# Patient Record
Sex: Male | Born: 1955 | Race: White | Hispanic: No | Marital: Married | Smoking: Never smoker
Health system: Southern US, Community
[De-identification: ages and names within clinical notes are randomized; demographics above are authoritative.]

## PROBLEM LIST (undated history)

## (undated) DIAGNOSIS — K219 Gastro-esophageal reflux disease without esophagitis: Secondary | ICD-10-CM

## (undated) HISTORY — DX: Gastro-esophageal reflux disease without esophagitis: K21.9

---

## 2005-09-15 ENCOUNTER — Ambulatory Visit: Payer: Self-pay | Admitting: Surgery

## 2005-10-04 ENCOUNTER — Ambulatory Visit: Payer: Self-pay | Admitting: Occupational Therapy

## 2011-04-18 ENCOUNTER — Encounter: Payer: Self-pay | Admitting: *Deleted

## 2011-04-18 ENCOUNTER — Encounter: Payer: Self-pay | Admitting: Cardiology

## 2011-04-18 ENCOUNTER — Telehealth: Payer: Self-pay | Admitting: *Deleted

## 2011-04-18 ENCOUNTER — Ambulatory Visit (INDEPENDENT_AMBULATORY_CARE_PROVIDER_SITE_OTHER): Admitting: Cardiology

## 2011-04-18 DIAGNOSIS — Z79899 Other long term (current) drug therapy: Secondary | ICD-10-CM

## 2011-04-18 DIAGNOSIS — R079 Chest pain, unspecified: Secondary | ICD-10-CM | POA: Insufficient documentation

## 2011-04-18 DIAGNOSIS — K219 Gastro-esophageal reflux disease without esophagitis: Secondary | ICD-10-CM | POA: Insufficient documentation

## 2011-04-18 NOTE — Telephone Encounter (Signed)
Patient has tr[employer]are prime which will require a referral from PCP.  He will also need a referral from PCP to see Dr. Myrtis Ser today.  Was the patient @ [facility] for an emergency recently?  If so, usually they will authorize a follow up visit due to an emergency, but we will still need a referral for the stress echo from the PCP.

## 2011-04-18 NOTE — Telephone Encounter (Signed)
STRESS ECHO SCHEDULED FOR 04-21-2011 @ [facility] CHECKING PERCERT-PATIENT DID NOT HAVE INSURANCE CARD WITH HIM.

## 2011-04-18 NOTE — Telephone Encounter (Signed)
Received a telephone call from [facility] Schedule [facility] stating that Jorge Bailey states we are not to cancel Jorge Bailey's stress test. Spoke with Charmaine and she is going to try to contact [employer] to see if we can obtain A PCP doctor for this patient. Jorge Bailey was not seen in the ER at [facility]. He only made contact with Dr. Daphine Deutscher which in Turn called Gene Serpe, PA trying to see if we could see patient in the office. Appointment was made for Jorge Bailey for 04-18-2011. When he came into the office he had no insurance identification to show to [place].

## 2011-04-18 NOTE — Assessment & Plan Note (Signed)
At this point I am not convinced that the patient has significant ischemic disease.  He does not have significant risk factors.  His symptom has been persistent.  This may represent GERD.  He isn't a very active field of work with much physical activity.  We need to proceed with a stress echo study to assess them fully.  In the meantime I have encouraged him to go back to full activities.

## 2011-04-18 NOTE — Patient Instructions (Signed)
Follow up as scheduled. Your physician has requested that you have a stress echocardiogram. For further information please visit [email or web address] Please follow instruction sheet as given. Your physician recommends that you go to the [facility] for lab work: CBC/CMET/TSH/FLP. Do not eat or drink after midnight.

## 2011-04-18 NOTE — Assessment & Plan Note (Signed)
There is a history of GERD in the past.  Some of his current symptoms may be GERD.  I've instructed him concerning the timing of when to take his medicine.  I also instructed him to try to not lie down after eating and also to try to keep the periods of vigorous physical activity away from being immediately after eating.  Also we will check screening labs as he has not had any in a long time.  He'll have chemistry hematology and fasting lipids.  I will be in touch with them the information.  Also see him back for followup.

## 2011-04-18 NOTE — Progress Notes (Signed)
HPI Patient is referred for evaluation of chest discomfort.  He is very active healthy gentleman.  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 He continues to be quite active.  He also runs.  He's not having any symptoms when he exercises.  However he's been having discomfort in his left chest.  In addition he has symptoms in his neck that may be related to GERD.  In the past he had GERD and felt better with Nexium.  More recently he is having persistent symptoms and Nexium is not helping.  There is no shortness of breath.  He recently has had a constant sensation in his chest and upper throat for several days.  There is no history of diabetes, hypertension, smoking, and lipid abnormalities, family history. Allergies  Allergen Reactions  . Sulfa Antibiotics     Spikes temp    Current Outpatient Prescriptions  Medication Sig Dispense Refill  . esomeprazole (NEXIUM) 40 MG capsule Take 40 mg by mouth daily before breakfast.        . fexofenadine (ALLEGRA) 180 MG tablet Take 180 mg by mouth daily.          History   Social History  . Marital Status: Married    Spouse Name: N/A    Number of Children: N/A  . Years of Education: N/A   Occupational History  . Not on file.   Social History Main Topics  . Smoking status: Never Smoker   . Smokeless tobacco: Never Used  . Alcohol Use: Not on file  . Drug Use: Not on file  . Sexually Active: Not on file   Other Topics Concern  . Not on file   Social History Narrative  . No narrative on file    No family history on file.  Past Medical History  Diagnosis Date  . Chest pain     July, 2012  . GERD (gastroesophageal reflux disease)     No past surgical history on file.  ROS  Patient denies fever, chills, headache, sweats, rash, change in vision, change in hearing, cough, nausea vomiting, urinary symptoms.  He's had some mild discomfort in his thighs at nighttime.  He's also had some low back discomfort.  All of the systems are reviewed  and are negative.  PHYSICAL EXAM Patient appears quite healthy.  He is oriented to person time and place.  Affect is normal.  Head is atraumatic.  There is no xanthelasma.  Lungs are clear.  Respiratory effort is unlabored.  Cardiac exam reveals S1-S2.  No clicks or significant murmurs.  The abdomen is soft.  No peripheral edema.  There are no musculoskeletal deformities.  There is no skin rashes. Filed Vitals:   04/18/11 0958  BP: 145/91  Pulse: 59  Height: 5\' 8"  (1.727 m)  Weight: 190 lb (86.183 kg)    EKG EKG is done today and reviewed by me.  Gait is normal.  ASSESSMENT & PLAN

## 2011-04-19 NOTE — Telephone Encounter (Signed)
Pt has [employer].  [facility] and [facility] are not Tricare "Prime" providers.   Per Armandina Gemma, [employer] representative, [phone],  excluding ER visits, pt must begin any care with his assigned [employer] primary care physician. If they need to refer pt for other care they will send him to a Tricare "Prime" MD with an official referral that is filed to [employer].  His [employer] PCP is the only one that is authorized to refer the pt elsewhere.  According to [employer] "Prime", the pt's PCP is Dr Beryle Flock, [address] [place], [place], phone#[phone]. If pt uses a [employer]are "Prime" provider and has the proper referral on file, he will pay a $12 copay for OVs and it varies what his copay is for any testing.  Dr. Daphine Deutscher was not authorized to send pt to [place] as an authorized [employer] "Prime" provider.  However, pt may see any MD he chooses, but if they are not an authorized T[employer] "Prime" it will be considered a "Point of Service (POS)" visit.  For POS, the pt will pay a $300 deductible and then will have a 50% costshare with [employer] for the Tricare allowable charge-for each charge.  In reference to the pt's stress echo, [facility] is a Tricare "Prime" facility.  However, if pt's wants [employer] to pay for this test, he must have a referral from his PCP.  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  It is all based on the military member's SSN and DOB.  If pt has any questions, he may contact [employer] "Prime" or his assigned [employer] "Prime" PCP at the phone numbers listed above.

## 2011-04-19 NOTE — Telephone Encounter (Signed)
Spoke w/pt's wife today.  Pt has appt w/PCP Dr. Justice Britain (assigned Tricare "Prime" PCP) today.  I explained to her about T[employer] "Prime" requirements listed in prior note.  She is aware of how T[employer] "Prime" works but her husband didn't understand insurance and@the  time,  he was stressed and concerned about his heart.   She states when [facility] called to schedule the appt they did ask if we took Tricare.  It was stated that we did take Tricare.  However, it was not relayed that we take only take Tricare "Standard" and Tricare "For Life".  It was not addressed that we were not an authorized Tricare "Prime" provider.  They will discuss pt's future care w/Dr Justice Britain and will probably cancel his future visits with [place] and go to a [employer]are "Prime" MD for further care.

## 2011-04-21 ENCOUNTER — Telehealth: Payer: Self-pay | Admitting: *Deleted

## 2011-04-21 DIAGNOSIS — R072 Precordial pain: Secondary | ICD-10-CM

## 2011-04-21 NOTE — Telephone Encounter (Signed)
Left message to notify pt that stress test is normal per Dr. Myrtis Ser.

## 2011-05-03 ENCOUNTER — Encounter: Payer: Self-pay | Admitting: Cardiology

## 2011-05-23 ENCOUNTER — Encounter: Payer: Self-pay | Admitting: *Deleted

## 2011-05-24 ENCOUNTER — Encounter: Payer: Self-pay | Admitting: *Deleted

## 2011-05-24 NOTE — Progress Notes (Signed)
  Letter mailed to remind pt to have CBC/CMET/TSH/FLP done per 7/10 OV-

## 2011-06-19 ENCOUNTER — Ambulatory Visit: Admitting: Cardiology

## 2020-04-10 ENCOUNTER — Encounter (HOSPITAL_COMMUNITY): Payer: Self-pay | Admitting: Emergency Medicine

## 2020-04-10 ENCOUNTER — Emergency Department (HOSPITAL_COMMUNITY)
Admission: EM | Admit: 2020-04-10 | Discharge: 2020-04-10 | Disposition: A | Discharge status: Home / Self Care | Payer: Medicare Other | Attending: Emergency Medicine | Admitting: Emergency Medicine

## 2020-04-10 ENCOUNTER — Emergency Department (HOSPITAL_COMMUNITY): Payer: Medicare Other

## 2020-04-10 ENCOUNTER — Other Ambulatory Visit: Payer: Self-pay

## 2020-04-10 DIAGNOSIS — R109 Unspecified abdominal pain: Secondary | ICD-10-CM | POA: Insufficient documentation

## 2020-04-10 DIAGNOSIS — R339 Retention of urine, unspecified: Secondary | ICD-10-CM | POA: Diagnosis present

## 2020-04-10 LAB — COMPREHENSIVE METABOLIC PANEL
ALT: 22 U/L (ref 0–44)
AST: 25 U/L (ref 15–41)
Albumin: 4.1 g/dL (ref 3.5–5.0)
Alkaline Phosphatase: 33 U/L — ABNORMAL LOW (ref 38–126)
Anion gap: 9 (ref 5–15)
BUN: 27 mg/dL — ABNORMAL HIGH (ref 8–23)
CO2: 26 mmol/L (ref 22–32)
Calcium: 8.6 mg/dL — ABNORMAL LOW (ref 8.9–10.3)
Chloride: 105 mmol/L (ref 98–111)
Creatinine, Ser: 1.97 mg/dL — ABNORMAL HIGH (ref 0.61–1.24)
GFR calc Af Amer: 40 mL/min — ABNORMAL LOW (ref >60)
GFR calc non Af Amer: 35 mL/min — ABNORMAL LOW (ref >60)
Glucose, Bld: 121 mg/dL — ABNORMAL HIGH (ref 70–99)
Potassium: 3.8 mmol/L (ref 3.5–5.1)
Sodium: 140 mmol/L (ref 135–145)
Total Bilirubin: 1 mg/dL (ref 0.3–1.2)
Total Protein: 7 g/dL (ref 6.5–8.1)

## 2020-04-10 LAB — CBC WITH DIFFERENTIAL/PLATELET
Abs Immature Granulocytes: 0.04 10*3/uL (ref 0.00–0.07)
Basophils Absolute: 0.1 10*3/uL (ref 0.0–0.1)
Basophils Relative: 0 %
Eosinophils Absolute: 0.1 10*3/uL (ref 0.0–0.5)
Eosinophils Relative: 1 %
HCT: 43.1 % (ref 39.0–52.0)
Hemoglobin: 14.4 g/dL (ref 13.0–17.0)
Immature Granulocytes: 0 %
Lymphocytes Relative: 6 %
Lymphs Abs: 0.7 10*3/uL (ref 0.7–4.0)
MCH: 33.5 pg (ref 26.0–34.0)
MCHC: 33.4 g/dL (ref 30.0–36.0)
MCV: 100.2 fL — ABNORMAL HIGH (ref 80.0–100.0)
Monocytes Absolute: 0.8 10*3/uL (ref 0.1–1.0)
Monocytes Relative: 7 %
Neutro Abs: 10.9 10*3/uL — ABNORMAL HIGH (ref 1.7–7.7)
Neutrophils Relative %: 86 %
Platelets: 253 10*3/uL (ref 150–400)
RBC: 4.3 MIL/uL (ref 4.22–5.81)
RDW: 12.5 % (ref 11.5–15.5)
WBC: 12.7 10*3/uL — ABNORMAL HIGH (ref 4.0–10.5)
nRBC: 0 % (ref 0.0–0.2)

## 2020-04-10 LAB — URINALYSIS, ROUTINE W REFLEX MICROSCOPIC
Bilirubin Urine: NEGATIVE
Glucose, UA: NEGATIVE mg/dL
Ketones, ur: NEGATIVE mg/dL
Nitrite: NEGATIVE
Protein, ur: NEGATIVE mg/dL
Specific Gravity, Urine: 1.01 (ref 1.005–1.030)
pH: 5 (ref 5.0–8.0)

## 2020-04-10 MED ORDER — KETOROLAC TROMETHAMINE 30 MG/ML IJ SOLN
15.0000 mg | Freq: Once | INTRAMUSCULAR | Status: AC
  Administered 2020-04-10: 15 mg via INTRAVENOUS
  Filled 2020-04-10: qty 1

## 2020-04-10 MED ORDER — ONDANSETRON HCL 4 MG/2ML IJ SOLN
4.0000 mg | Freq: Once | INTRAMUSCULAR | Status: DC
  Filled 2020-04-10: qty 2

## 2020-04-10 MED ORDER — ONDANSETRON 4 MG PO TBDP: ORAL_TABLET | ORAL | 0 refills | Status: DC

## 2020-04-10 MED ORDER — TAMSULOSIN HCL 0.4 MG PO CAPS: 0.4000 mg | ORAL_CAPSULE | Freq: Every day | ORAL | 1 refills | Status: DC

## 2020-04-10 MED ORDER — HYDROCODONE-ACETAMINOPHEN 5-325 MG PO TABS: 1.0000 | ORAL_TABLET | Freq: Four times a day (QID) | ORAL | 0 refills | Status: DC | PRN

## 2020-04-10 NOTE — ED Triage Notes (Signed)
Pt woke up at 0600 this morning with urinary retention, right flank pain that radiates to his right groin with n/v

## 2020-04-10 NOTE — Discharge Instructions (Addendum)
Drink plenty of fluids follow-up with [facility] next week and return if any problem

## 2020-04-10 NOTE — ED Provider Notes (Signed)
[facility] EMERGENCY DEPARTMENT Provider Note   CSN: [id number] Arrival date & time: 04/10/20  8850     History Chief Complaint  Patient presents with  . Urinary Retention  . Flank Pain    Jorge Bailey is a 64 y.o. male.  Patient complains of left groin pain.  Patient states it started at 6 AM this morning.  Mild nausea  The history is provided by the patient. No language interpreter was used.  Flank Pain This is a new problem. The current episode started 3 to 5 hours ago. The problem occurs constantly. The problem has not changed since onset.Pertinent negatives include no chest pain, no abdominal pain and no headaches. Nothing aggravates the symptoms. He has tried nothing for the symptoms. The treatment provided no relief.       Past Medical History:  Diagnosis Date  . Chest pain    July, 2012  . GERD (gastroesophageal reflux disease)     Patient Active Problem List   Diagnosis Date Noted  . GERD (gastroesophageal reflux disease)   . Chest pain     History reviewed. No pertinent surgical history.     History reviewed. No pertinent family history.  Social History   Tobacco Use  . Smoking status: Never Smoker  . Smokeless tobacco: Never Used  Substance Use Topics  . Alcohol use: Not on file  . Drug use: Not on file    Home Medications Prior to Admission medications   Medication Sig Start Date End Date Taking? Authorizing Provider  esomeprazole (NEXIUM) 40 MG capsule Take 40 mg by mouth daily before breakfast.     Yes [provider]  ibuprofen (ADVIL) 800 MG tablet Take 800 mg by mouth every 8 (eight) hours as needed.   Yes [provider]  HYDROcodone-acetaminophen (NORCO/VICODIN) 5-325 MG tablet Take 1 tablet by mouth every 6 (six) hours as needed. 04/10/20   Bethann Berkshire, MD  ondansetron (ZOFRAN ODT) 4 MG disintegrating tablet 4mg  ODT q4 hours prn nausea/vomit 04/10/20   06/11/20, MD  tamsulosin (FLOMAX) 0.4 MG CAPS capsule Take 1  capsule (0.4 mg total) by mouth daily. 04/10/20   06/11/20, MD    Allergies    Sulfa antibiotics  Review of Systems   Review of Systems  Constitutional: Negative for appetite change and fatigue.  HENT: Negative for congestion, ear discharge and sinus pressure.   Eyes: Negative for discharge.  Respiratory: Negative for cough.   Cardiovascular: Negative for chest pain.  Gastrointestinal: Negative for abdominal pain and diarrhea.  Genitourinary: Positive for flank pain. Negative for frequency and hematuria.  Musculoskeletal: Negative for back pain.  Skin: Negative for rash.  Neurological: Negative for seizures and headaches.  Psychiatric/Behavioral: Negative for hallucinations.    Physical Exam Updated Vital Signs BP (!) 155/100 (BP Location: Left Arm)   Pulse 62   Temp 97.9 F (36.6 C) (Oral)   Resp 17   Ht 5\' 6"  (1.676 m)   Wt 79.4 kg   SpO2 98%   BMI 28.25 kg/m   Physical Exam Vitals and nursing note reviewed.  Constitutional:      Appearance: He is well-developed.  HENT:     Head: Normocephalic.     Nose: Nose normal.  Eyes:     General: No scleral icterus.    Conjunctiva/sclera: Conjunctivae normal.  Neck:     Thyroid: No thyromegaly.  Cardiovascular:     Rate and Rhythm: Normal rate and regular rhythm.     Heart  sounds: No murmur heard.  No friction rub. No gallop.   Pulmonary:     Breath sounds: No stridor. No wheezing or rales.  Chest:     Chest wall: No tenderness.  Abdominal:     General: There is no distension.     Tenderness: There is no abdominal tenderness. There is no rebound.  Musculoskeletal:        General: Normal range of motion.     Cervical back: Neck supple.     Comments: Tender left flank  Lymphadenopathy:     Cervical: No cervical adenopathy.  Skin:    Findings: No erythema or rash.  Neurological:     Mental Status: He is alert and oriented to person, place, and time.     Motor: No abnormal muscle tone.     Coordination:  Coordination normal.  Psychiatric:        Behavior: Behavior normal.     ED Results / Procedures / Treatments   Labs (all labs ordered are listed, but only abnormal results are displayed) Labs Reviewed  CBC WITH DIFFERENTIAL/PLATELET - Abnormal; Notable for the following components:      Result Value   WBC 12.7 (*)    MCV 100.2 (*)    Neutro Abs 10.9 (*)    All other components within normal limits  COMPREHENSIVE METABOLIC PANEL - Abnormal; Notable for the following components:   Glucose, Bld 121 (*)    BUN 27 (*)    Creatinine, Ser 1.97 (*)    Calcium 8.6 (*)    Alkaline Phosphatase 33 (*)    GFR calc non Af Amer 35 (*)    GFR calc Af Amer 40 (*)    All other components within normal limits  URINALYSIS, ROUTINE W REFLEX MICROSCOPIC - Abnormal; Notable for the following components:   APPearance HAZY (*)    Hgb urine dipstick MODERATE (*)    Leukocytes,Ua TRACE (*)    Bacteria, UA RARE (*)    All other components within normal limits  URINE CULTURE    EKG None  Radiology CT Renal Stone Study  Result Date: 04/10/2020 CLINICAL DATA:  Left flank pain with nausea vomiting EXAM: CT ABDOMEN AND PELVIS WITHOUT CONTRAST TECHNIQUE: Multidetector CT imaging of the abdomen and pelvis was performed following the standard protocol without IV contrast. COMPARISON:  None. FINDINGS: Lower chest: No acute abnormality. Hepatobiliary: No focal liver abnormality is seen. No gallstones, gallbladder wall thickening, or biliary dilatation. Pancreas: Unremarkable. No pancreatic ductal dilatation or surrounding inflammatory changes. Spleen: Normal in size without focal abnormality. Adrenals/Urinary Tract: The bilateral adrenal glands are normal. A normal right kidney is not seen. It is either absent or markedly atrophic. There are multiple nonobstructing stones in the left kidney. There is moderate left hydroureteronephrosis due to obstruction by a 2 mm stone in the left ureteral vesicular junction. The  bladder is partially decompressed. Stomach/Bowel: Stomach is within normal limits. Appendix is not seen but no inflammation is noted around cecum. No evidence of bowel wall thickening, distention, or inflammatory changes. Vascular/Lymphatic: Aortic atherosclerosis. No enlarged abdominal or pelvic lymph nodes. Reproductive: Prostate gland is normal. 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 Musculoskeletal: Degenerative joint changes of the spine are noted. IMPRESSION: 1. Moderate left hydroureteronephrosis due to obstruction by a 2 mm stone in the left ureteral vesicular junction. 2. A normal right kidney is not seen. It is either absent or markedly atrophic. 3. Aortic atherosclerosis. Aortic Atherosclerosis (ICD10-I70.0). Electronically Signed  By: Sherian Rein M.D.   On: 04/10/2020 10:29    Procedures Procedures (including critical care time)  Medications Ordered in ED Medications  ondansetron (ZOFRAN) injection 4 mg (4 mg Intravenous Not Given 04/10/20 1033)  ketorolac (TORADOL) 30 MG/ML injection 15 mg (15 mg Intravenous Given 04/10/20 1033)    ED Course  I have reviewed the triage vital signs and the nursing notes.  Pertinent labs & imaging results that were available during my care of the patient were reviewed by me and considered in my medical decision making (see chart for details).    MDM Rules/Calculators/A&P                          Patient with a small kidney stone in the left ureter.  He will be placed on Flomax and given Zofran and hydrocodone and referred to the urology.         This patient presents to the ED for concern of flank pain, this involves an extensive number of treatment options, and is a complaint that carries with it a high risk of complications and morbidity.  The differential diagnosis includes kidney stone UTI  Lab Tests:   I Ordered, reviewed, and interpreted labs, which included CBC and chemistries which show elevated white  count and mild dehydration  Medicines ordered:   I ordered medication Toradol Zofran  Imaging Studies ordered:   I ordered imaging studies which included CT abdomen and  I independently visualized and interpreted imaging which showed kidney stone  Additional history obtained:   Additional history obtained from records  Previous records obtained and reviewed.  Consultations Obtained:   Reevaluation:  After the interventions stated above, I reevaluated the patient and found improved  Critical Interventions:  .           Final Clinical Impression(s) / ED Diagnoses Final diagnoses:  Flank pain    Rx / DC Orders ED Discharge Orders         Ordered    ondansetron (ZOFRAN ODT) 4 MG disintegrating tablet     Discontinue  Reprint     04/10/20 1201    HYDROcodone-acetaminophen (NORCO/VICODIN) 5-325 MG tablet  Every 6 hours PRN     Discontinue  Reprint     04/10/20 1201    tamsulosin (FLOMAX) 0.4 MG CAPS capsule  Daily     Discontinue  Reprint     04/10/20 1201           Bethann Berkshire, MD 04/10/20 1207

## 2020-04-12 LAB — URINE CULTURE: Culture: NO GROWTH

## 2021-08-27 IMAGING — CT CT RENAL STONE PROTOCOL
2 of 4 series · 16 of 46 positions shown, 18 images · non-contrast
Comparison: None.

CLINICAL DATA: Left flank pain with nausea vomiting

EXAM:
CT ABDOMEN AND PELVIS WITHOUT CONTRAST
TECHNIQUE: Multidetector CT imaging of the abdomen and pelvis was performed
following the standard protocol without IV contrast.

[Series 2: axial st · axial · 0.81mm/px · z∈[+690,+1130]mm · 13 of 100 slices shown, 15 images]
[im 6/100  soft-tissue]
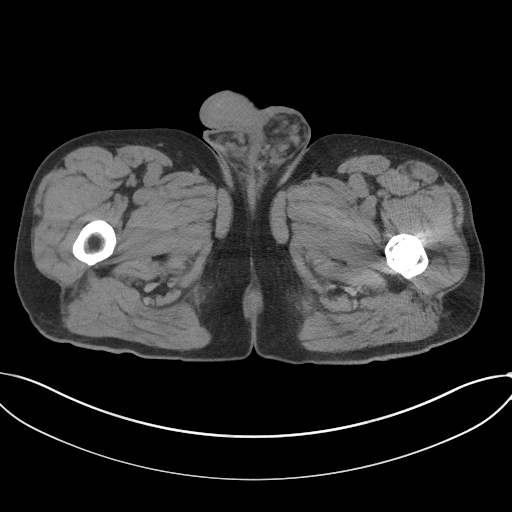
[im 6/100  bone]
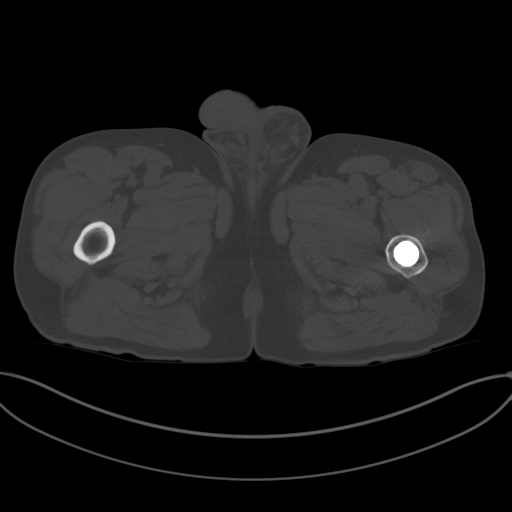
[im 12/100  soft-tissue]
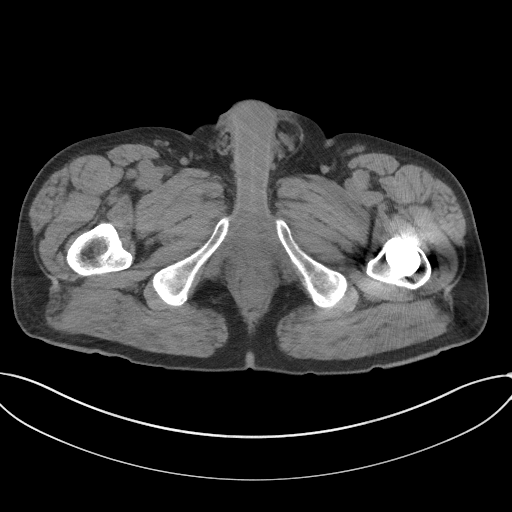
[im 24/100  soft-tissue]
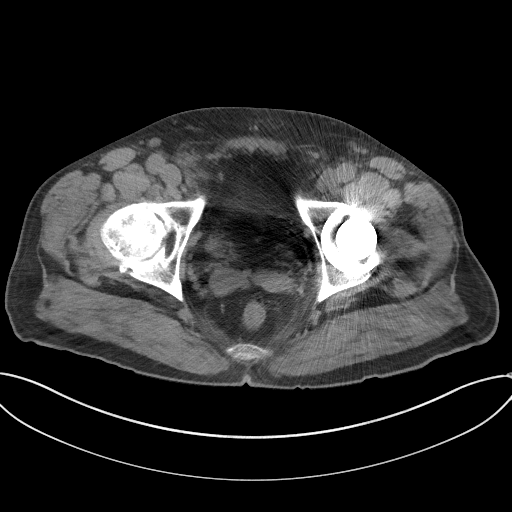
[im 30/100  soft-tissue]
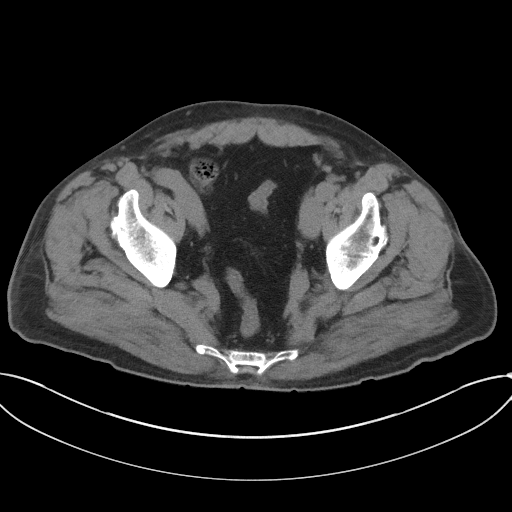
[im 35/100  soft-tissue]
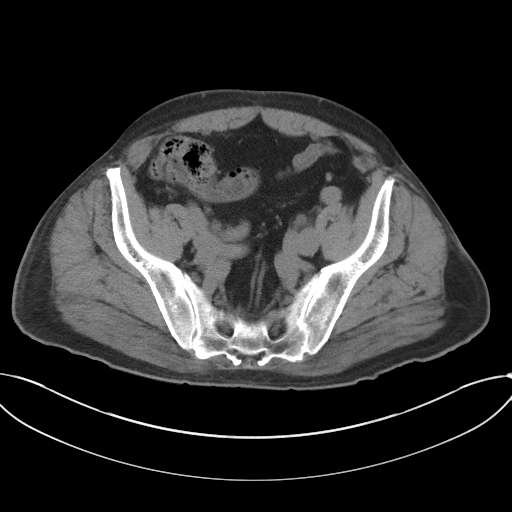
[im 41/100  soft-tissue]
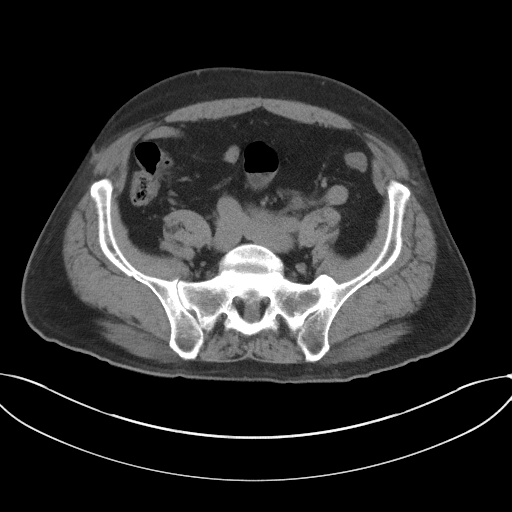
[im 53/100  soft-tissue]
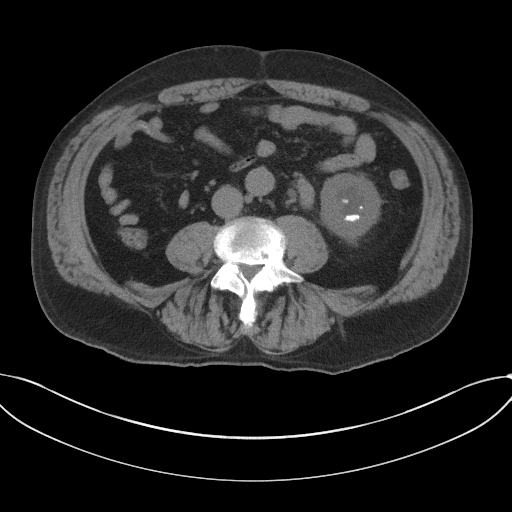
[im 59/100  soft-tissue]
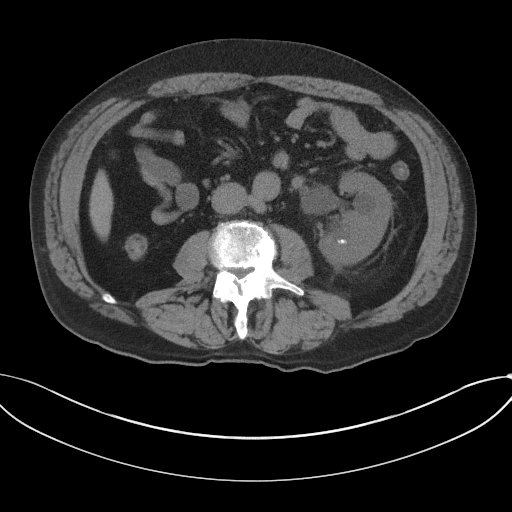
[im 65/100  soft-tissue]
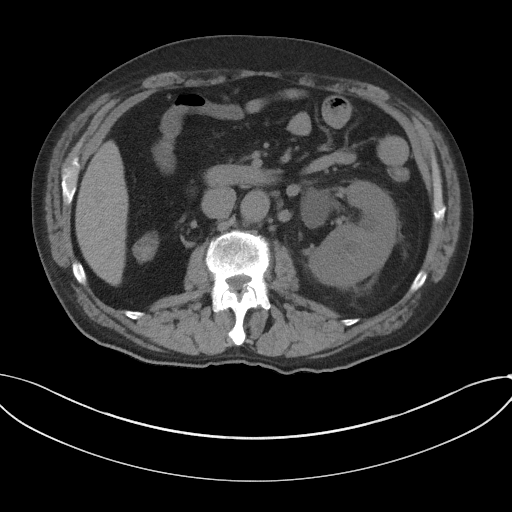
[im 65/100  bone]
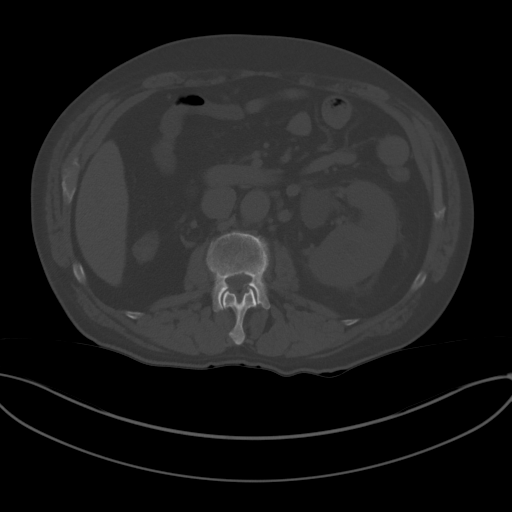
[im 70/100  soft-tissue]
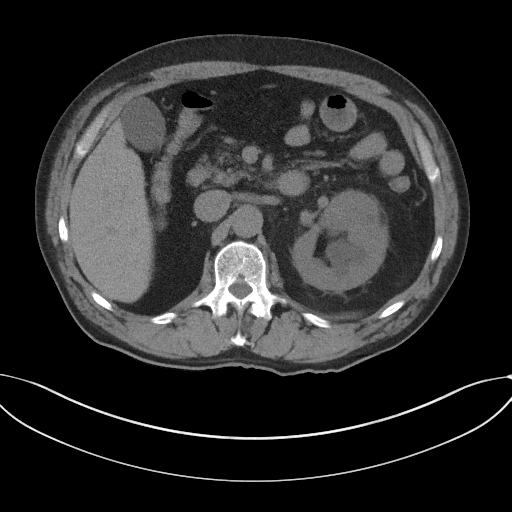
[im 76/100  soft-tissue]
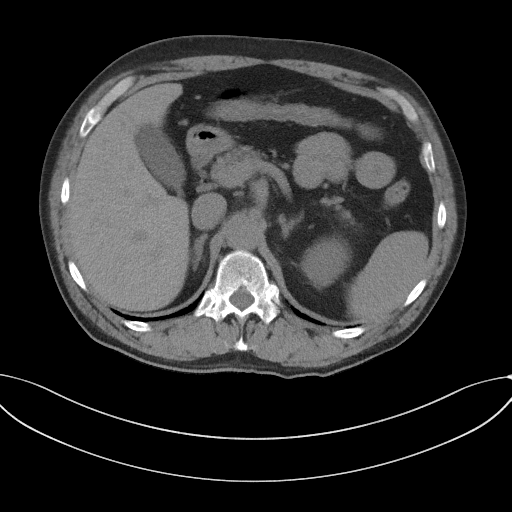
[im 88/100  soft-tissue]
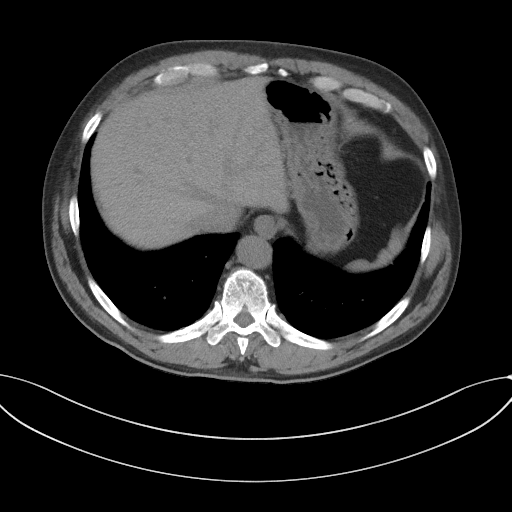
[im 94/100  soft-tissue]
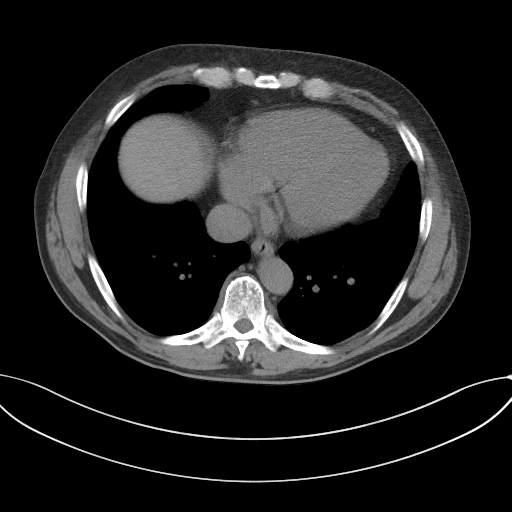

[Series 5: coronal st · coronal · 0.80mm/px · 3 of 110 slices shown]
[im 37/110  soft-tissue]
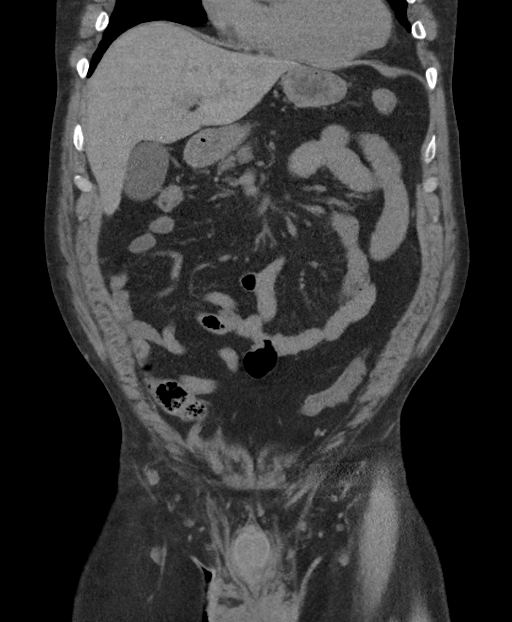
[im 49/110  soft-tissue]
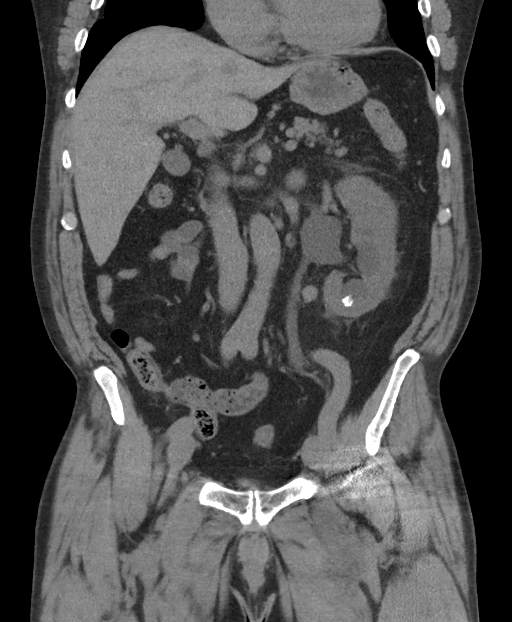
[im 61/110  soft-tissue]
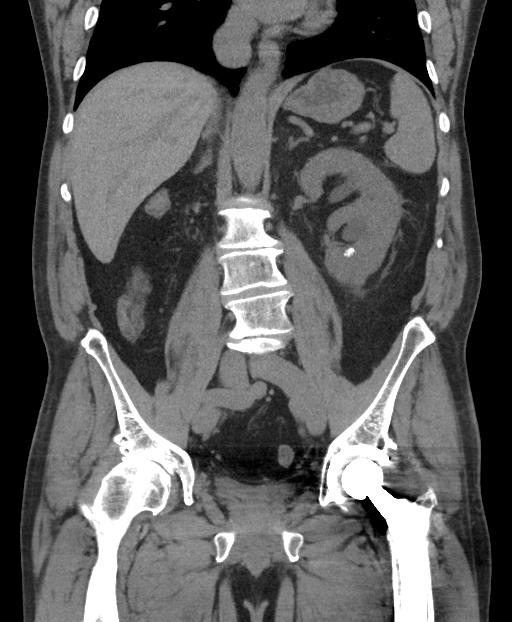

[16 of 46 positions shown; findings below may reference images not displayed]

FINDINGS: Lower chest: No acute abnormality.

Hepatobiliary: No focal liver abnormality is seen. No gallstones,
gallbladder wall thickening, or biliary dilatation.

Pancreas: Unremarkable. No pancreatic ductal dilatation or
surrounding inflammatory changes.

Spleen: Normal in size without focal abnormality.

Adrenals/Urinary Tract: The bilateral adrenal glands are normal. A
normal right kidney is not seen. It is either absent or markedly
atrophic. There are multiple nonobstructing stones in the left
kidney. There is moderate left hydroureteronephrosis due to
obstruction by a 2 mm stone in the left ureteral vesicular junction.
The bladder is partially decompressed.

Stomach/Bowel: Stomach is within normal limits. Appendix is not seen
but no inflammation is noted around cecum. No evidence of bowel wall
thickening, distention, or inflammatory changes.

Vascular/Lymphatic: Aortic atherosclerosis. No enlarged abdominal or
pelvic lymph nodes.

Reproductive: Prostate gland is normal.

Other: Bilateral inguinal herniation of mesenteric fat are noted.

Musculoskeletal: Degenerative joint changes of the spine are noted.
IMPRESSION: 1. Moderate left hydroureteronephrosis due to obstruction by a 2 mm
stone in the left ureteral vesicular junction.
2. A normal right kidney is not seen. It is either absent or
markedly atrophic.
3. Aortic atherosclerosis.

Aortic Atherosclerosis ([id number]-[id number]).

## 2024-02-06 ENCOUNTER — Other Ambulatory Visit (HOSPITAL_COMMUNITY): Payer: Self-pay | Admitting: Nurse Practitioner

## 2024-02-06 DIAGNOSIS — R011 Cardiac murmur, unspecified: Secondary | ICD-10-CM

## 2024-03-13 ENCOUNTER — Ambulatory Visit (HOSPITAL_COMMUNITY)
Admission: RE | Admit: 2024-03-13 | Discharge: 2024-03-13 | Disposition: A | Discharge status: Home / Self Care | Source: Ambulatory Visit | Attending: Nurse Practitioner | Admitting: Nurse Practitioner

## 2024-03-13 DIAGNOSIS — R011 Cardiac murmur, unspecified: Secondary | ICD-10-CM | POA: Diagnosis present

## 2024-03-13 LAB — ECHOCARDIOGRAM COMPLETE
AR max vel: 2.3 cm2
AV Area VTI: 2.77 cm2
AV Area mean vel: 2.4 cm2
AV Mean grad: 3.2 mmHg
AV Peak grad: 6.6 mmHg
Ao pk vel: 1.29 m/s
Area-P 1/2: 4.36 cm2
S' Lateral: 3 cm

## 2024-03-13 NOTE — Progress Notes (Signed)
*  PRELIMINARY RESULTS* Echocardiogram 2D Echocardiogram has been performed.  Jorge Bailey 03/13/2024, 5:31 PM

## 2024-06-22 ENCOUNTER — Emergency Department (HOSPITAL_COMMUNITY)

## 2024-06-22 ENCOUNTER — Encounter (HOSPITAL_COMMUNITY): Payer: Self-pay

## 2024-06-22 ENCOUNTER — Emergency Department (HOSPITAL_COMMUNITY)
Admission: EM | Admit: 2024-06-22 | Discharge: 2024-06-22 | Disposition: A | Discharge status: Home / Self Care | Attending: Emergency Medicine | Admitting: Emergency Medicine

## 2024-06-22 ENCOUNTER — Other Ambulatory Visit: Payer: Self-pay

## 2024-06-22 DIAGNOSIS — R4182 Altered mental status, unspecified: Secondary | ICD-10-CM | POA: Insufficient documentation

## 2024-06-22 DIAGNOSIS — X58XXXA Exposure to other specified factors, initial encounter: Secondary | ICD-10-CM | POA: Diagnosis not present

## 2024-06-22 DIAGNOSIS — E876 Hypokalemia: Secondary | ICD-10-CM | POA: Insufficient documentation

## 2024-06-22 DIAGNOSIS — S01512A Laceration without foreign body of oral cavity, initial encounter: Secondary | ICD-10-CM | POA: Insufficient documentation

## 2024-06-22 DIAGNOSIS — S0993XA Unspecified injury of face, initial encounter: Secondary | ICD-10-CM

## 2024-06-22 LAB — CBC WITH DIFFERENTIAL/PLATELET
Abs Immature Granulocytes: 0.03 K/uL (ref 0.00–0.07)
Basophils Absolute: 0 K/uL (ref 0.0–0.1)
Basophils Relative: 0 %
Eosinophils Absolute: 0.4 K/uL (ref 0.0–0.5)
Eosinophils Relative: 4 %
HCT: 39.3 % (ref 39.0–52.0)
Hemoglobin: 13.1 g/dL (ref 13.0–17.0)
Immature Granulocytes: 0 %
Lymphocytes Relative: 13 %
Lymphs Abs: 1.2 K/uL (ref 0.7–4.0)
MCH: 33.9 pg (ref 26.0–34.0)
MCHC: 33.3 g/dL (ref 30.0–36.0)
MCV: 101.6 fL — ABNORMAL HIGH (ref 80.0–100.0)
Monocytes Absolute: 0.5 K/uL (ref 0.1–1.0)
Monocytes Relative: 6 %
Neutro Abs: 6.9 K/uL (ref 1.7–7.7)
Neutrophils Relative %: 77 %
Platelets: UNDETERMINED K/uL (ref 150–400)
RBC: 3.87 MIL/uL — ABNORMAL LOW (ref 4.22–5.81)
RDW: 12.8 % (ref 11.5–15.5)
WBC: 9.1 K/uL (ref 4.0–10.5)
nRBC: 0 % (ref 0.0–0.2)

## 2024-06-22 LAB — COMPREHENSIVE METABOLIC PANEL WITH GFR
ALT: 19 U/L (ref 0–44)
AST: 26 U/L (ref 15–41)
Albumin: 3.7 g/dL (ref 3.5–5.0)
Alkaline Phosphatase: 37 U/L — ABNORMAL LOW (ref 38–126)
Anion gap: 12 (ref 5–15)
BUN: 14 mg/dL (ref 8–23)
CO2: 21 mmol/L — ABNORMAL LOW (ref 22–32)
Calcium: 8.7 mg/dL — ABNORMAL LOW (ref 8.9–10.3)
Chloride: 103 mmol/L (ref 98–111)
Creatinine, Ser: 0.85 mg/dL (ref 0.61–1.24)
GFR, Estimated: >60 mL/min (ref >60)
Glucose, Bld: 115 mg/dL — ABNORMAL HIGH (ref 70–99)
Potassium: 3.2 mmol/L — ABNORMAL LOW (ref 3.5–5.1)
Sodium: 136 mmol/L (ref 135–145)
Total Bilirubin: 0.9 mg/dL (ref 0.0–1.2)
Total Protein: 6.2 g/dL — ABNORMAL LOW (ref 6.5–8.1)

## 2024-06-22 LAB — URINALYSIS, W/ REFLEX TO CULTURE (INFECTION SUSPECTED)
Bacteria, UA: NONE SEEN
Bilirubin Urine: NEGATIVE
Glucose, UA: NEGATIVE mg/dL
Hgb urine dipstick: NEGATIVE
Ketones, ur: 5 mg/dL — AB
Leukocytes,Ua: NEGATIVE
Nitrite: NEGATIVE
Protein, ur: 100 mg/dL — AB
Specific Gravity, Urine: 1.014 (ref 1.005–1.030)
pH: 5 (ref 5.0–8.0)

## 2024-06-22 MED ORDER — IBUPROFEN 400 MG PO TABS
600.0000 mg | ORAL_TABLET | Freq: Once | ORAL | Status: DC
  Filled 2024-06-22: qty 1

## 2024-06-22 MED ORDER — POTASSIUM CHLORIDE CRYS ER 20 MEQ PO TBCR
40.0000 meq | EXTENDED_RELEASE_TABLET | Freq: Once | ORAL | Status: AC
  Administered 2024-06-22: 40 meq via ORAL
  Filled 2024-06-22: qty 2

## 2024-06-22 MED ORDER — LIDOCAINE 5 % EX PTCH
1.0000 | MEDICATED_PATCH | CUTANEOUS | Status: DC
  Filled 2024-06-22: qty 1

## 2024-06-22 MED ORDER — VALTOCO 10 MG DOSE 10 MG/0.1ML NA LIQD: 7.5000 mg | NASAL | 2 refills | Status: AC | PRN

## 2024-06-22 NOTE — ED Provider Notes (Signed)
 [place] EMERGENCY DEPARTMENT AT [facility] Provider Note   CSN: [id number] Arrival date & time: 06/22/24  1740     History Chief Complaint  Patient presents with   Seizures    HPI: Jorge Bailey is a 68 y.o. male with history pertinent for GERD, allergies, osteoarthritis who presents complaining of episode of altered mental status. Patient arrived via EMS from home.  History provided by patient and EMS.  No interpreter required during this encounter.  Patient reports that he does not remember the event.  Reports that the last thing that he remembers is coming back home from church, and thereafter waking up in an ambulance.  Denies any complaints.  Per EMS report, patient reportedly was found in his car by his wife because the horn was continually honking, patient was altered when wife found him, however had improvement in mental status and route with EMS.  No medications prior to arrival.  Patient denies a personal or family history of prior seizures, denies significant alcohol use, states that he drinks less than 1 alcoholic beverage per week.  Reports no recent change in his alcohol intake, denies any recent trauma or falls, denies any history of known cancer.  Patient's recorded medical, surgical, social, medication list and allergies were reviewed in the Snapshot window as part of the initial history.   Prior to Admission medications   Medication Sig Start Date End Date Taking? Authorizing Provider  diazePAM  (VALTOCO  10 MG DOSE) 10 MG/0.1ML LIQD Place 7.5 mg into the nose as needed (for seizure like activity lasting longer than 5 minutes). Placed 7.5 mg into each nostril of the nose as needed for seizure-like activity lasting longer than 5 minutes 06/22/24  Yes Rogelia Jerilynn RAMAN, MD  esomeprazole (NEXIUM) 40 MG capsule Take 40 mg by mouth daily before breakfast.      [provider]  HYDROcodone -acetaminophen  (NORCO/VICODIN) 5-325 MG tablet Take 1 tablet by mouth  every 6 (six) hours as needed. 04/10/20   Zammit, Joseph, MD  ibuprofen  (ADVIL ) 800 MG tablet Take 800 mg by mouth every 8 (eight) hours as needed.    [provider]  ondansetron  (ZOFRAN  ODT) 4 MG disintegrating tablet 4mg  ODT q4 hours prn nausea/vomit 04/10/20   Zammit, Joseph, MD  tamsulosin  (FLOMAX ) 0.4 MG CAPS capsule Take 1 capsule (0.4 mg total) by mouth daily. 04/10/20   Suzette Pac, MD     Allergies: Sulfa antibiotics   Review of Systems   ROS as per HPI  Physical Exam Updated Vital Signs BP (!) 153/104   Pulse 63   Temp 97.8 F (36.6 C) (Oral)   Resp 16   SpO2 98%  Physical Exam Vitals and nursing note reviewed.  Constitutional:      General: He is not in acute distress.    Appearance: He is well-developed.  HENT:     Head: Normocephalic and atraumatic.     Mouth/Throat:     Comments: Small, laceration to the right lateral aspect of tongue, hemostatic, small amount of dried blood on the face, see image Eyes:     Conjunctiva/sclera: Conjunctivae normal.  Cardiovascular:     Rate and Rhythm: Normal rate and regular rhythm.     Heart sounds: No murmur heard. Pulmonary:     Effort: Pulmonary effort is normal. No respiratory distress.     Breath sounds: Normal breath sounds.  Abdominal:     Palpations: Abdomen is soft.     Tenderness: There is no abdominal tenderness.  Musculoskeletal:        General: No swelling.     Cervical back: Neck supple.  Skin:    General: Skin is warm and dry.     Capillary Refill: Capillary refill takes less than 2 seconds.  Neurological:     General: No focal deficit present.     Mental Status: He is alert and oriented to person, place, and time.     Cranial Nerves: No cranial nerve deficit.     Sensory: No sensory deficit.     Motor: No weakness.     Coordination: Coordination normal.  Psychiatric:        Mood and Affect: Mood normal.     ED Course/ Medical Decision Making/ A&P    Procedures Procedures    Medications Ordered in ED Medications  lidocaine  (LIDODERM ) 5 % 1 patch (1 patch Transdermal Not Given 06/22/24 2036)  ibuprofen  (ADVIL ) tablet 600 mg (600 mg Oral Not Given 06/22/24 2037)  potassium chloride  SA (KLOR-CON  M) CR tablet 40 mEq (40 mEq Oral Given 06/22/24 2032)    Medical Decision Making:   Jorge Bailey is a 68 y.o. male who presents for episode of altered mental status with associated tongue biting as per above.  Physical exam is pertinent for superficial tongue laceration, hemostatic with dried blood on the side of face, no focal neurologic deficit.   The differential includes but is not limited to seizure, seizure-like activity, hypoglycemia, electrolyte derangement, UTI, ICH, TBI, malignancy.  Independent historian: EMS  External data reviewed: No pertinent external data  Initial Plan:  CT head to evaluate for large intracranial mass, ICH Screening labs including CBC and Metabolic panel to evaluate for infectious or metabolic etiology of disease.  Urinalysis with reflex culture ordered to evaluate for UTI or relevant urologic/nephrologic pathology.  EKG to evaluate for cardiac pathology  Labs: Ordered, Independent interpretation, and Details: CBC without leukocytosis, anemia.  CMP without AKI, emergent electrolyte derangement, emergent LFT abnormality.  UA without UTI  Radiology: Ordered, Independent interpretation, Details: CT head without ICH, displaced fracture, mass lesion, MLS, loss of gray/white matter differentiation, CT C-spine without fracture, dislocation, traumatic malalignment, and All images reviewed independently.  Agree with radiology report at this time.   CT Cervical Spine Wo Contrast Result Date: 06/22/2024 EXAM: CT CERVICAL SPINE WITHOUT CONTRAST 06/22/2024 06:43:38 PM TECHNIQUE: CT of the cervical spine was performed without the administration of intravenous contrast. Multiplanar reformatted images are provided for review. Automated exposure control,  iterative reconstruction, and/or weight based adjustment of the mA/kV was utilized to reduce the radiation dose to as low as reasonably achievable. COMPARISON: None available. CLINICAL HISTORY: Neck trauma (Age >= 65y). Pt BIB GEMS from home d/t new onset of seizures. Unwitnessed. Pt's wife found him in the car honking, came out to check on him. She found the pt with blood coming out his mouth. Small lac noted on his tongue. Pt was post-ictal and incontinent. FINDINGS: CERVICAL SPINE: BONES AND ALIGNMENT: No acute fracture or traumatic malalignment. Straightening of the normal cervical lordosis is present. DEGENERATIVE CHANGES: Chronic loss of disc height and endplate changes are most evident at C3-4, C4-5 and C6-7. Moderate foraminal narrowing is present bilaterally at C3-4, C4-5 and C6-7. Mild foraminal narrowing is present bilaterally at C5-6. SOFT TISSUES: No prevertebral soft tissue swelling. IMPRESSION: 1. No acute abnormality of the cervical spine related to the reported neck trauma. 2. Straightening of the normal cervical lordosis. 3. Chronic loss of disc height and endplate changes at C3-4,  C4-5, and C6-7. 4. Moderate foraminal narrowing bilaterally at C3-4, C4-5, and C6-7. Mild foraminal narrowing bilaterally at C5-6. Electronically signed by: Lonni Necessary MD 06/22/2024 06:58 PM EDT RP Workstation: [id number]   CT Head Wo Contrast Result Date: 06/22/2024 EXAM: CT HEAD WITHOUT CONTRAST 06/22/2024 06:43:38 PM TECHNIQUE: CT of the head was performed without the administration of intravenous contrast. Automated exposure control, iterative reconstruction, and/or weight based adjustment of the mA/kV was utilized to reduce the radiation dose to as low as reasonably achievable. COMPARISON: None available. CLINICAL HISTORY: Seizure, new-onset, no history of trauma. Pt BIB GEMS from home d/t new onset of seizures. Unwitnessed. Pt's wife found him in the car honking, came out to check on him. She found the  pt with blood coming out his mouth. Small lac noted on his tongue. Pt was post-ictal and incontinent. FINDINGS: BRAIN AND VENTRICLES: No acute hemorrhage. No evidence of acute infarct. No hydrocephalus. No extra-axial collection. No mass effect or midline shift. ORBITS: No acute abnormality. SINUSES: Mild mucosal thickening is present within the anterior left ethmoid air cells. SOFT TISSUES AND SKULL: No acute soft tissue abnormality. No skull fracture. IMPRESSION: 1. No acute intracranial abnormality. 2. Mild mucosal thickening within the anterior left ethmoid air cells. Electronically signed by: Lonni Necessary MD 06/22/2024 06:56 PM EDT RP Workstation: [id number]    EKG/Medicine tests: Ordered and Independent interpretation EKG Interpretation: Sinus rhythm Left ventricular hypertrophy No previous ECGs available Confirmed by Rogelia Satterfield ([id number]) on 06/22/2024 6:18:58 PM                 Interventions: Potassium repletion  See the EMR for full details regarding lab and imaging results.  Patient overall well-appearing on exam.  No witnessed seizure, however description of altered mental status that improved with associated tongue biting is concerning for a witnessed seizure.  Patient has no prior history of this, has no localizing deficits on exam, however do feel that he warrants imaging and screening labs.  These were obtained and do not reveal acute metabolic abnormality.  Mild hypokalemia which was repleted.  CT head and C-spine without acute intracranial abnormality.  Wife presented to bedside, providing further history, patient got in his car to go to church, she heard the car start, however noted that it was left running, and assumed that he was talking on the phone.  Reports that she then heard the horn beep once, but noticed that the patient still left, therefore she went out to check on him.  Reports that she found him leaning to the side in the front seat, no ongoing seizure-like  activity, however patient had had urinary incontinence, and had small amount of blood at the side of his mouth.  Reports that the patient had eyes open spontaneously, was able to move extremities somewhat, however did not speak back to her.  Reports that EMS was called, and they gave him a medication through his nose which improved his mental status.  Overall given history concerning for postictal state, tongue biting, urinary incontinence, presentation is most concerning for unwitnessed seizure.  Discussed with patient, wife, and minister at bedside that workup does not reveal etiology of seizure, no emergent electrolyte derangement, evidence of infection, hypoglycemia, ICH, or overt brain lesion.  Did discuss that CT does not rule out the possibility of brain lesion such as cancer, and that he will need further workup in the outpatient setting likely including MRI to further rule out this possibility.  Given patient is back to baseline,  and has not had additional episodes, do not feel that patient requires admission for workup at this time.  Extensively discussed seizure precautions at bedside, will discharge with prescription for rescue Valtoco .  Patient and mother blunts at bedside comfortable this plan, discharged with neurology referral.  Presentation is most consistent with acute complicated illness  Discussion of management or test interpretations with external provider(s): Not indicated  Risk Drugs:Prescription drug management  Disposition: DISCHARGE: I believe that the patient is safe for discharge home with outpatient follow-up. Patient was informed of all pertinent physical exam, laboratory, and imaging findings. Patient's suspected etiology of their symptom presentation was discussed with the patient and all questions were answered. We discussed following up with PCP, neurology. I provided thorough ED return precautions. The patient feels safe and comfortable with this plan.  MDM generated  using voice dictation software and may contain dictation errors.  Please contact me for any clarification or with any questions.  Clinical Impression:  1. Altered mental status, unspecified altered mental status type   2. Injury of tongue, initial encounter      Discharge   Final Clinical Impression(s) / ED Diagnoses Final diagnoses:  Altered mental status, unspecified altered mental status type  Injury of tongue, initial encounter    Rx / DC Orders ED Discharge Orders          Ordered    diazePAM  (VALTOCO  10 MG DOSE) 10 MG/0.1ML LIQD  As needed        06/22/24 2036    Ambulatory referral to Neurology       Comments: An appointment is requested in approximately: 1 week   06/22/24 2035             Rogelia Jerilynn RAMAN, MD 06/23/24 0030

## 2024-06-22 NOTE — ED Triage Notes (Signed)
 Pt BIB GEMS from home d/t new onset of seizures.  Unwitnessed. Pt's wife found him in the car honking, came out to check on him. She found the pt with blood coming out his mouth. Small lac noted on his tongue. Pt was post-ictal and incontinent. Pt appears to be A&O X4 at this moment. VSS.

## 2024-06-22 NOTE — Discharge Instructions (Signed)
 Reyes Mimes  Thank you for allowing us  to take care of you today.  You came to the Emergency Department today because your wife found you in the car with altered consciousness, and you had bitten your tongue.  You are not back to usual self, it is most likely given the description of how you are acting after the event as well as the tongue biting that you had a seizure.  Seizures can happen because of lots of different reasons such as abnormal blood salts, blood sugar, infection, bleeding in the brain, or possibly malignancy in the brain.  Here in the emergency department we got a CT of your head and neck which does not show any large masses in your brain, we do not see any elevation of your white blood cell count concerning for infection, you do not have any other symptoms on exam concerning for infection, your blood sugar and blood salts are normal, and your exam is reassuring.  It is unclear what exactly caused your seizure, however we would not find any emergency abnormalities on your labs or imaging to explain why you had a seizure.  You will need follow-up with neurology for further workup.  For people who have only had 1 seizure, often times they will not have another seizure in their lifetime, therefore it is not standard to put people on antiseizure medications after their first seizure.  Seizure precautions: Take showers instead of baths due to the risk of drowning.  If the patient is in the bathtub, constant supervision is required at all times. Direct supervision is required at all times when swimming or when in or around bodies of water.  It is best to be at a lifeguarded facility.  It is best to swim in water that is clear and where one can see to the bottom of the water. No climbing heights significantly greater than the child is tall Direct supervision required around stoves, ovens, fireplaces, campfires, or other sources of heat or fire. When riding a bike, scooter, roller skates,  skate board, ATV, always wear a helmet  Avoid sleeping on the top bunk. In children over age 34, no driving or operating heavy machinery unless cleared by the doctor In the state of [place] , you are not allowed to drive for 6 months after a seizure that involved altered consciousness.  We will prescribe you a rescue medication for seizures, if you have a seizure or seizure-like episode that lasts for greater than 5 minutes, please use the intranasal Valtoco  to stop the seizure, and call 911  To-Do: 1. Please follow-up with your primary doctor within 1 week/ as soon as possible.   Please return to the Emergency Department or call 911 if you experience have worsening of your symptoms, or do not get better, repeat seizures, chest pain, shortness of breath, severe or significantly worsening pain, high fever, severe confusion, pass out or have any reason to think that you need emergency medical care.   We hope you feel better soon.   Mitzie Later, MD Department of Emergency Medicine [facility]

## 2024-07-04 ENCOUNTER — Ambulatory Visit: Admitting: Neurology

## 2024-07-04 ENCOUNTER — Encounter: Payer: Self-pay | Admitting: Neurology

## 2024-07-04 VITALS — BP 151/97 | HR 69 | Ht 65.0 in | Wt 174.0 lb

## 2024-07-04 DIAGNOSIS — R569 Unspecified convulsions: Secondary | ICD-10-CM

## 2024-07-04 NOTE — Progress Notes (Signed)
 NEUROLOGY CONSULTATION NOTE  Jorge Bailey MRN: [id number] DOB: Nov 16, 1955  Referring provider: Dr. Jerilynn Bailey Primary care provider: Dr. Norleen Bailey  Reason for consult:  altered mental status  Dear Dr Bailey:  Thank you for your kind referral of Jorge Bailey for consultation of the above symptoms. Although his history is well known to you, please allow me to reiterate it for the purpose of our medical record. The patient was accompanied to the clinic by his wife who also provides collateral information. Records and images were personally reviewed where available.   HISTORY OF PRESENT ILLNESS: This is a 68 year old right-handed man with a history of degenerative arthritis, presenting for evaluation of an episode of altered mental status that occurred on 06/22/2024. He was in his usual state of health and recalls getting into the truck to attend a deacon's meeting, then waking up in the ambulance. His wife reports he was fine when he left the house, she heard him crank the engine but did not hear him leave. In a few minutes, she heard the horn beep and went to check on him, she saw him sitting inside the truck with eyes open, unresponsive. She opened the door and recalls he was sitting with his hands on the steering wheel, blood tricking down the side of his mouth. There was a small amount of urinary incontinence. He started to get out of the truck, he was restless and resistant but not combative. He was still unresponsive and did not follow instructions, she had to hold him against the truck until EMS arrived. He did not want to go on the stretcher, he was non-verbal at that point. EMS administered 2.5mg  Versed intranasally and within seconds he answered with one word responses (yeah). He was back to baseline in the ER and denied any focal weakness or any other symptoms such as headache, dizziness. CBC, CMP unremarkable. I personally reviewed head CT without contrast which did not show any  acute changes. EKG showed sinus rhythm, LVH.  They deny any prior episode of staring/unresponsiveness. No gaps in time, olfactory/gustatory hallucinations, rising epigastric sensation, focal numbness/tingling/weakness, myoclonic jerks. He has occasional headaches due to ragweed. No dizziness, diplopia, dysarthria/dysphagia, neck/back pain, bowel/bladder dysfunction. Memory okay. He has a lot of right hip pain. He usually gets 8 hours of sleep. No sleep deprivation, alcohol, new medications prior to the episode. No palpitations, shortness of breath, chest pain. He recalls a syncopal episode 60 years ago in school while watching a gory film. No other episodes of loss of consciousness.   He had a normal birth and early development.  There is no history of febrile convulsions, CNS infections such as meningitis/encephalitis, significant traumatic brain injury, neurosurgical procedures, or family history of seizures.    PAST MEDICAL HISTORY: Past Medical History:  Diagnosis Date   Chest pain    July, 2012   GERD (gastroesophageal reflux disease)     PAST SURGICAL HISTORY: History reviewed. No pertinent surgical history.  MEDICATIONS: Current Outpatient Medications on File Prior to Visit  Medication Sig Dispense Refill   diazePAM  (VALTOCO  10 MG DOSE) 10 MG/0.1ML LIQD Place 7.5 mg into the nose as needed (for seizure like activity lasting longer than 5 minutes). Placed 7.5 mg into each nostril of the nose as needed for seizure-like activity lasting longer than 5 minutes 2 each 2   esomeprazole (NEXIUM) 40 MG capsule Take 40 mg by mouth daily before breakfast.       Fexofenadine HCl (ALLEGRA PO)  Take by mouth.     HYDROcodone -acetaminophen  (NORCO/VICODIN) 5-325 MG tablet Take 1 tablet by mouth every 6 (six) hours as needed. 20 tablet 0   ibuprofen  (ADVIL ) 800 MG tablet Take 800 mg by mouth every 8 (eight) hours as needed.     No current facility-administered medications on file prior to visit.     ALLERGIES: Allergies  Allergen Reactions   Cat Dander     Hives   Shellfish Allergy    Short Ragweed Pollen Ext Other (See Comments)    Eyes water   Sulfa Antibiotics     Spikes temp    FAMILY HISTORY: Family History  Problem Relation Age of Onset   Heart failure Father     SOCIAL HISTORY: Social History   Socioeconomic History   Marital status: Married    Spouse name: Not on file   Number of children: Not on file   Years of education: Not on file   Highest education level: Not on file  Occupational History   Not on file  Tobacco Use   Smoking status: Never   Smokeless tobacco: Never  Substance and Sexual Activity   Alcohol use: Never   Drug use: Never   Sexual activity: Not on file  Other Topics Concern   Not on file  Social History Narrative   Living with 9 cats, 2 horses 2 story home, 14 steps   Right handed   Caffeine 1-2 coffee, soda 1 can daily   Social Drivers of Corporate investment banker Strain: Not on file  Food Insecurity: Low Risk  (08/16/2023)   Received from [facility]   Hunger Vital Sign    Within the past 12 months, you worried that your food would run out before you got money to buy more: Never true    Within the past 12 months, the food you bought just didn't last and you didn't have money to get more. : Never true  Transportation Needs: No Transportation Needs (08/16/2023)   Received from [facility]    In the past 12 months, has lack of reliable transportation kept you from medical appointments, meetings, work or from getting things needed for daily living? : No  Physical Activity: Not on file  Stress: Not on file  Social Connections: Not on file  Intimate Partner Violence: Not on file     PHYSICAL EXAM: Vitals:   07/04/24 1238  BP: (!) 151/97  Pulse: 69  SpO2: 97%   General: No acute distress Head:  Normocephalic/atraumatic Skin/Extremities: No rash, no edema Neurological Exam: Mental status:  alert and oriented to person, place, and time, no dysarthria or aphasia, Fund of knowledge is appropriate.  Recent and remote memory are intact, 3/3 delayed recall.  Attention and concentration are normal, 5/5 WORLD backwards.  Cranial nerves: CN I: not tested CN II: pupils equal, round, visual fields intact CN III, IV, VI:  full range of motion, no nystagmus, no ptosis CN V: facial sensation intact CN VII: upper and lower face symmetric CN VIII: hearing intact to conversation Bulk & Tone: normal, no fasciculations. Motor: 5/5 throughout with no pronator drift. Sensation: intact to light touch, cold, pin, vibration sense.  Deep Tendon Reflexes: +2 throughout except for +1 right patella Cerebellar: no incoordination on finger to nose testing Gait: favors right leg due to right hip pain, no ataxia Tremor: none  IMPRESSION: This is a 68 year old right-handed man with a history of degenerative arthritis, presenting for  evaluation of an episode of altered mental status that occurred on 06/22/2024. We discussed that the semiology of the episode is strongly suggestive of a focal seizure. MRI brain with and without contrast and 1-hour EEG will be ordered. If normal, we will do a 24-hour EEG. We discussed that after an initial seizure, unless there are significant risk factors, an abnormal neurological exam, an EEG showing epileptiform abnormalities, and/or abnormal neuroimaging, treatment with an antiepileptic drug is not indicated. . We discussed 10% of the population may have a single seizure. Patients with a single unprovoked seizure have a recurrence rate of 33% after a single seizure and 73% after a second seizure. We discussed [place] driving restrictions which indicate a patient needs to free of seizures or events of altered awareness for 6 months prior to resuming driving, he is understandably upset about this. Follow-up in 3 months or earlier if needed, call for any changes.    Thank you for allowing  me to participate in the care of this patient. Please do not hesitate to call for any questions or concerns.   Darice Shivers, M.D.  CC: Dr. Rogelia, Dr. Shona

## 2024-07-04 NOTE — Patient Instructions (Addendum)
 Good to meet you.  Schedule MRI brain with and without contrast at [facility] [phone]  2. Schedule 1-hour EEG. If normal, we will do a 24-hour EEG  3. Follow-up in 3 months, call for any changes   Seizure Precautions: 1. If medication has been prescribed for you to prevent seizures, take it exactly as directed.  Do not stop taking the medicine without talking to your doctor first, even if you have not had a seizure in a long time.   2. Avoid activities in which a seizure would cause danger to yourself or to others.  Don't operate dangerous machinery, swim alone, or climb in high or dangerous places, such as on ladders, roofs, or girders.  Do not drive unless your doctor says you may.  3. If you have any warning that you may have a seizure, lay down in a safe place where you can't hurt yourself.    4.  No driving for 6 months from last seizure, as per [place]  state law.   Please refer to the following link on the Epilepsy Foundation of America's website for more information: http://www.epilepsyfoundation.org/answerplace/Social/driving/drivingu.cfm   5.  Maintain good sleep hygiene. Avoid alcohol  6.  Contact your doctor if you have any problems that may be related to the medicine you are taking.  7.  Call 911 and bring the patient back to the ED if:        A.  The seizure lasts longer than 5 minutes.       B.  The patient doesn't awaken shortly after the seizure  C.  The patient has new problems such as difficulty seeing, speaking or moving  D.  The patient was injured during the seizure  E.  The patient has a temperature over 102 F (39C)  F.  The patient vomited and now is having trouble breathing

## 2024-07-08 ENCOUNTER — Ambulatory Visit: Admitting: Neurology

## 2024-07-08 DIAGNOSIS — R569 Unspecified convulsions: Secondary | ICD-10-CM

## 2024-07-08 NOTE — Progress Notes (Unsigned)
 EEG complete and ready for review.

## 2024-07-09 NOTE — Procedures (Signed)
 ELECTROENCEPHALOGRAM REPORT  Date of Study: 07/08/2024  Patient's Name: Jorge Bailey MRN: [id number] Date of Birth: 1955/12/29  Referring Provider: Dr. Darice Shivers  Clinical History: This is a 68 year old man with new onset seizure on 06/22/24. EEG for classification.  Medications: Nexium  Technical Summary: A multichannel digital EEG recording measured by the international 10-20 system with electrodes applied with paste and impedances below 5000 ohms performed in our laboratory with EKG monitoring in an awake and asleep patient.  Hyperventilation was not performed. Photic stimulation was performed.  The digital EEG was referentially recorded, reformatted, and digitally filtered in a variety of bipolar and referential montages for optimal display.    Description: The patient is awake and asleep during the recording.  During maximal wakefulness, there is a symmetric, medium voltage 11-12 Hz posterior dominant rhythm that attenuates with eye opening.  The record is symmetric.  During drowsiness and sleep, there is an increase in theta slowing of the background.  Vertex waves and symmetric sleep spindles were seen. Photic stimulation did not elicit any abnormalities.  There were no epileptiform discharges or electrographic seizures seen.    EKG lead was unremarkable.  Impression: This awake and asleep EEG is normal.    Clinical Correlation: A normal EEG does not exclude a clinical diagnosis of epilepsy.  If further clinical questions remain, prolonged EEG may be helpful.  Clinical correlation is advised.   Darice Shivers, M.D.

## 2024-07-16 ENCOUNTER — Ambulatory Visit
Admission: RE | Admit: 2024-07-16 | Discharge: 2024-07-16 | Disposition: A | Discharge status: Home / Self Care | Source: Ambulatory Visit | Attending: Neurology | Admitting: Neurology

## 2024-07-16 MED ORDER — GADOPICLENOL 0.5 MMOL/ML IV SOLN
7.5000 mL | Freq: Once | INTRAVENOUS | Status: AC | PRN
  Administered 2024-07-16: 7.5 mL via INTRAVENOUS

## 2024-07-21 ENCOUNTER — Telehealth: Payer: Self-pay

## 2024-07-21 ENCOUNTER — Telehealth: Payer: Self-pay | Admitting: Neurology

## 2024-07-21 DIAGNOSIS — I639 Cerebral infarction, unspecified: Secondary | ICD-10-CM

## 2024-07-21 DIAGNOSIS — R4020 Unspecified coma: Secondary | ICD-10-CM

## 2024-07-21 NOTE — Telephone Encounter (Signed)
 [facility] called with an urgent call for MRI results  1. 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 2. Small focus of chronic encephalomalacia within the posterolateral left temporal lobe, which may be post-traumatic in etiology or may reflect a chronic infarct. 3. Mild cerebral white matter chronic small vessel ischemic disease. 4. 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 5. Mild generalized parenchymal atrophy. 6. Mild paranasal sinus disease.  Results sent to Dr Georjean

## 2024-07-21 NOTE — Telephone Encounter (Signed)
 Pt called in this afternoon and he stated he receive his MRI results on his Mychart. Pt would like to receive a call back to discuss the results. Thanks

## 2024-07-21 NOTE — Telephone Encounter (Signed)
 Discussed MRI brain results with patient, there is a punctate infarct in the vermis and small focus of chronic encephalomalacia in the left temporal lobe. He denies any prior head injuries, so this is likely a chronic infarct. Echo nl. Since he has had strokes in 2 different locations, recommend 30-day holter. Also start daily aspirin 81mg . The initial episode still concerning for seizure, EEG normal. Proceed also with 24-hour EEG. He was advised to go to the ER for any sudden change in symptoms.  Heather, pls order 30-day holter monitor (dx: stroke); also 24-hour EEG (loss of consciousness). thanks

## 2024-07-22 NOTE — Telephone Encounter (Signed)
 Orders places for 30-day holter monitor (dx: stroke); also 24-hour EEG (loss of consciousness).

## 2024-07-22 NOTE — Telephone Encounter (Signed)
 See other phone note

## 2024-08-18 ENCOUNTER — Telehealth: Payer: Self-pay | Admitting: *Deleted

## 2024-08-19 NOTE — Telephone Encounter (Signed)
 Pt cld After hours Nurse to sched 24 hr eeg

## 2024-09-02 ENCOUNTER — Telehealth: Payer: Self-pay | Admitting: Neurology

## 2024-09-02 NOTE — Telephone Encounter (Signed)
 Pt dropped off a Preoperative Clearance Form today  that needs to be filled out by Dr. Almeta would would like to try to pick it up Monday. The form is in Box. Thanks

## 2024-09-02 NOTE — Telephone Encounter (Signed)
 Pt called an informed that Dr Georjean is out of the office until Monday that his form will not be ready then Pt verbalized understanding, he was informed that I will call him when it was ready

## 2024-09-08 ENCOUNTER — Ambulatory Visit

## 2024-09-08 DIAGNOSIS — R4020 Unspecified coma: Secondary | ICD-10-CM | POA: Diagnosis not present

## 2024-09-08 NOTE — Progress Notes (Unsigned)
 Ambulatory EEG hooked up and running. Light flashing. Push button tested. Camera and event log explained. Batteries explained. Patient understood.

## 2024-09-09 NOTE — Progress Notes (Unsigned)
 AMB EEG discontinued.  Skin Breakdown:No Diary Returned: No - no events noted

## 2024-09-12 NOTE — Procedures (Signed)
 ELECTROENCEPHALOGRAM REPORT  Dates of Recording: 09/08/2024 10:36AM to 09/09/2024 11:10AM  Patient's Name: Jorge Bailey MRN: [id number] Date of Birth: 1956/06/17  Referring Provider: Dr. Darice Shivers  Procedure: 24-hour ambulatory EEG  History: This is a 68 year old man with new onset seizure on 06/22/24. EEG for classification.   Medications: Nexium  Technical Summary: This is a 24-hour multichannel digital EEG recording measured by the international 10-20 system with electrodes applied with paste and impedances below 5000 ohms performed as portable with EKG monitoring.  The digital EEG was referentially recorded, reformatted, and digitally filtered in a variety of bipolar and referential montages for optimal display.    DESCRIPTION OF RECORDING: During maximal wakefulness, the background activity consisted of a symmetric 10 Hz posterior dominant rhythm which was reactive to eye opening.  There were no epileptiform discharges or focal slowing seen in wakefulness.  During the recording, the patient progresses through wakefulness, drowsiness, and Stage 2 sleep.  Again, there were no epileptiform discharges seen.  Events: There were 15 push button events that appear accidental. No video recorded. He denied any symptoms and did not fill out symptom diary.  There were no electrographic seizures seen.  EKG lead was unremarkable.  IMPRESSION: This 24-hour ambulatory EEG study is normal.    CLINICAL CORRELATION: A normal EEG does not exclude a clinical diagnosis of epilepsy.  If further clinical questions remain, inpatient video EEG monitoring may be helpful.   Darice Shivers, M.D.

## 2024-09-15 NOTE — Telephone Encounter (Signed)
 Pt called an informed that that ideally we would like to wait 6 months after a stroke before doing surgery, but if surgery is really necessary, at least 3 months. The stroke on his MRI was in October, would wait until January at the least. Pt was not happy he wants to know if we can still end the paper work and say he can NOT has surgery until January ,

## 2024-09-15 NOTE — Telephone Encounter (Signed)
 Jorge Bailey called back to follow up on clearance for Surgery. He stated that he is not very happy, and that his surgery is being held up waiting on a piece of paper. Each day that its not sent back, Bumps the surgery back. He has request for the form to be filled out asap.

## 2024-09-16 NOTE — Telephone Encounter (Signed)
 Done, thanks

## 2024-09-17 NOTE — Telephone Encounter (Signed)
 Pt called informed that Dr Georjean will see him on Friday at 1:30 he will take the appointment, so she can go over his MRI again where he had a stroke. He stated that he was thankful that I did fax his surgical clearance yesterday even though it says he can't have it until January

## 2024-09-17 NOTE — Telephone Encounter (Signed)
 Jorge Bailey(Self) lvm stating that he has been trying for 2 wks to get documentation sent to surgeon from [place]. He stated that he received a call that it won't be sent until January because a stroke, he stated he didn't have a stroke.   PH: [phone]

## 2024-09-17 NOTE — Telephone Encounter (Signed)
 Clearance faxed to [facility] pt not cleared until 3 months post stroke,

## 2024-09-19 ENCOUNTER — Telehealth: Payer: Self-pay | Admitting: Neurology

## 2024-09-19 ENCOUNTER — Encounter: Payer: Self-pay | Admitting: Neurology

## 2024-09-19 ENCOUNTER — Ambulatory Visit: Admitting: Neurology

## 2024-09-19 VITALS — BP 146/97 | HR 68 | Ht 65.0 in | Wt 178.2 lb

## 2024-09-19 DIAGNOSIS — R4020 Unspecified coma: Secondary | ICD-10-CM | POA: Diagnosis not present

## 2024-09-19 DIAGNOSIS — I639 Cerebral infarction, unspecified: Secondary | ICD-10-CM

## 2024-09-19 NOTE — Patient Instructions (Addendum)
 Good to see you. Wishing you well with the upcoming surgery  Continue daily aspirin, control of blood pressure, cholesterol, glucose levels  2. Schedule holter monitor when able  3. It is prudent to recommend that all persons should be free of syncope or episodes of loss of awareness for at least six months to be granted the driving privilege. (THE [employer]  PHYSICIAN'S GUIDE TO DRIVER MEDICAL EVALUATION, Second Edition, Medical Review Branch, Associate Professor, Division of [employer], [place]  Department of Transportation, July 2004)  4. Follow-up in 3 months, call for any changes

## 2024-09-19 NOTE — Progress Notes (Signed)
 NEUROLOGY FOLLOW UP OFFICE NOTE  Jorge Bailey [id number] 01/31/1956  Discussed the use of AI scribe software for clinical note transcription with the patient, who gave verbal consent to proceed.  History of Present Illness I had the pleasure of seeing Jorge Bailey in follow-up in the neurology clinic on 09/21/2024.  The patient was last seen 3 months ago after an episode of altered mental status that occurred on 06/22/24, concerning for focal impaired awareness seizure. He is alone in the office today and presents to discuss results of testing. Records and images were personally reviewed where available.  I personally reviewed brain MRI with and without contrast done 07/2024 and went over images with the patient today. There is a punctate focus of mild diffusion-weight signal change in the right aspect of the cerebellar vermis, most consistent with a subacute infarct. There was a small focus of chronic encephalomalacia in the posterolateral left temporal lobe and chronic infarcts in the bilateral cerebellar hemispheres, mild chronic microvascular disease, mild diffuse atrophy. Hippocampi symmetric with no abnormal signal or enhancement seen. His routine EEG in 06/2024 and 24-hour ambulatory EEG in 09/2024 were normal.   He denies any further similar episodes since 06/22/24. He denies any staring/unresponsive episodes, gaps in time, olfactory/gustatory hallucinations, focal numbness/tingling/weakness, myoclonic jerks. No headaches, dizziness, vision changes, no falls. We discussed MRI findings. He denies any significant injuries, he does recall a fall and hitting his head at age 27-12 while changing a light bulb. He denies any prior history of stroke. He is taking the daily aspirin as discussed on the phone. He has the holter monitor at home but has not worn it yet. His main concern has been right hip pain affecting his mobility, he is anxious to have surgery soon.     History on Initial Assessment  07/04/2024: This is a 68 year old right-handed man with a history of degenerative arthritis, presenting for evaluation of an episode of altered mental status that occurred on 06/22/2024. He was in his usual state of health and recalls getting into the truck to attend a deacon's meeting, then waking up in the ambulance. His wife reports he was fine when he left the house, she heard him crank the engine but did not hear him leave. In a few minutes, she heard the horn beep and went to check on him, she saw him sitting inside the truck with eyes open, unresponsive. She opened the door and recalls he was sitting with his hands on the steering wheel, blood tricking down the side of his mouth. There was a small amount of urinary incontinence. He started to get out of the truck, he was restless and resistant but not combative. He was still unresponsive and did not follow instructions, she had to hold him against the truck until EMS arrived. He did not want to go on the stretcher, he was non-verbal at that point. EMS administered 2.5mg  Versed intranasally and within seconds he answered with one word responses (yeah). He was back to baseline in the ER and denied any focal weakness or any other symptoms such as headache, dizziness. CBC, CMP unremarkable. I personally reviewed head CT without contrast which did not show any acute changes. EKG showed sinus rhythm, LVH.  They deny any prior episode of staring/unresponsiveness. No gaps in time, olfactory/gustatory hallucinations, rising epigastric sensation, focal numbness/tingling/weakness, myoclonic jerks. He has occasional headaches due to ragweed. No dizziness, diplopia, dysarthria/dysphagia, neck/back pain, bowel/bladder dysfunction. Memory okay. He has a lot of right hip  pain. He usually gets 8 hours of sleep. No sleep deprivation, alcohol, new medications prior to the episode. No palpitations, shortness of breath, chest pain. He recalls a syncopal episode 60 years ago in  school while watching a gory film. No other episodes of loss of consciousness.   He had a normal birth and early development.  There is no history of febrile convulsions, CNS infections such as meningitis/encephalitis, significant traumatic brain injury, neurosurgical procedures, or family history of seizures.  Diagnostic Data: Brain MRI with and without contrast done 07/2024 showed a punctate focus of mild diffusion-weight signal change in the right aspect of the cerebellar vermis, most consistent with a subacute infarct. There was a small focus of chronic encephalomalacia in the posterolateral left temporal lobe and chronic infarcts in the bilateral cerebellar hemispheres, mild chronic microvascular disease, mild diffuse atrophy. Hippocampi symmetric with no abnormal signal or enhancement seen.   Routine EEG in 06/2024 and 24-hour ambulatory EEG in 09/2024 were normal.   PAST MEDICAL HISTORY: Past Medical History:  Diagnosis Date   Chest pain    July, 2012   GERD (gastroesophageal reflux disease)     MEDICATIONS: Medications Ordered Prior to Encounter[1]  ALLERGIES: Allergies[2]  FAMILY HISTORY: Family History  Problem Relation Age of Onset   Heart failure Father     SOCIAL HISTORY: Social History   Socioeconomic History   Marital status: Married    Spouse name: Not on file   Number of children: Not on file   Years of education: Not on file   Highest education level: Not on file  Occupational History   Not on file  Tobacco Use   Smoking status: Never   Smokeless tobacco: Never  Vaping Use   Vaping status: Never Used  Substance and Sexual Activity   Alcohol use: Never   Drug use: Never   Sexual activity: Not on file  Other Topics Concern   Not on file  Social History Narrative   Living with 9 cats, 2 horses 2 story home, 14 steps   Right handed   Caffeine 1-2 coffee, soda 1 can daily   Social Drivers of Health   Tobacco Use: Low Risk (09/19/2024)   Patient  History    Smoking Tobacco Use: Never    Smokeless Tobacco Use: Never    Passive Exposure: Not on file  Financial Resource Strain: Not on file  Food Insecurity: Low Risk (08/16/2023)   Received from [facility]   Epic    Within the past 12 months, you worried that your food would run out before you got money to buy more: Never true    Within the past 12 months, the food you bought just didn't last and you didn't have money to get more. : Never true  Transportation Needs: No Transportation Needs (08/16/2023)   Received from [facility]    In the past 12 months, has lack of reliable transportation kept you from medical appointments, meetings, work or from getting things needed for daily living? : No  Physical Activity: Not on file  Stress: Not on file  Social Connections: Not on file  Intimate Partner Violence: Not on file  Depression (EYV7-0): Not on file  Alcohol Screen: Not on file  Housing: Low Risk (08/16/2023)   Received from [facility]   Epic    What is your living situation today?: I have a steady place to live    Think about the place you live. Do you have  problems with any of the following? Choose all that apply:: None/None on this list  Utilities: Low Risk (08/16/2023)   Received from [facility]   Utilities    In the past 12 months has the electric, gas, oil, or water company threatened to shut off services in your home? : No  Health Literacy: Not on file     PHYSICAL EXAM: Vitals:   09/19/24 1255  BP: (!) 146/97  Pulse: 68  SpO2: 96%   General: No acute distress Head:  Normocephalic/atraumatic Skin/Extremities: No rash, no edema Neurological Exam: alert and awake. No aphasia or dysarthria. Fund of knowledge is appropriate. Attention and concentration are normal.   Cranial nerves: Pupils equal, round. Extraocular movements intact with no nystagmus. Visual fields full.  No facial asymmetry.  Motor: Bulk and tone normal, muscle strength 5/5  throughout with no pronator drift. Sensation intact to temperature. Reflexes +1 throughout. Finger to nose testing intact.  Gait slow and cautious due to hip pain, no ataxia.    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 He denies any further similar episodes since then. We discussed brain MRI showing a punctate subacute infarct in the right aspect of the cerebellar vermis, there were also chronic infarcts in the posterolateral left temporal lobe and bilateral cerebellar hemispheres which he is asymptomatic from. Discussed that these are likely due to small vessel disease, however since in different distributions, holter monitor is recommended to assess for central source. Continue aspirin 81mg  daily and control of vascular risk factors for secondary stroke prevention. He reports regular follow-up with his PCP and control of vascular risk factors. We again discussed recurrence risk if this was in fact a seizure and monitoring symptoms off medication. He is anxious to have hip surgery. The American Heart Association and American Stroke Association scientific statement in 2021 advised delaying elective nonneurologic, noncardiac surgery at least 6 months, we discussed that the risk of recurrent stroke was highest in patients undergoing surgery within 3 months of a previous stroke so if surgery is necessary, would wait at least 3 months. Patient expressed understanding. He is aware of [place] driving laws to stop driving after an episode of loss of awareness until 6 months event-free. Follow-up in 3 months, call for any changes.    Thank you for allowing me to participate in his care.  Please do not hesitate to call for any questions or concerns.  The duration of this appointment visit was 47 minutes of face-to-face time with the patient.  Greater than 50% of this  time was spent in counseling, explanation of diagnosis, planning of further management, and coordination of care.   Darice Shivers, M.D.   CC: Coralee Macadam, FNP        [1]  Current Outpatient Medications on File Prior to Visit  Medication Sig Dispense Refill   acetaminophen  (TYLENOL ) 325 MG tablet Take 650 mg by mouth every 6 (six) hours as needed.     aspirin EC 81 MG tablet Take 81 mg by mouth daily. Swallow whole.     diazePAM  (VALTOCO  10 MG DOSE) 10 MG/0.1ML LIQD Place 7.5 mg into the nose as needed (for seizure like activity lasting longer than 5 minutes). Placed 7.5 mg into each nostril of the nose as needed for seizure-like activity lasting longer than 5 minutes 2 each 2   esomeprazole (NEXIUM)  40 MG capsule Take 40 mg by mouth daily before breakfast.       Fexofenadine HCl (ALLEGRA PO) Take by mouth.     HYDROcodone -acetaminophen  (NORCO/VICODIN) 5-325 MG tablet Take 1 tablet by mouth every 6 (six) hours as needed. (Patient not taking: Reported on 09/19/2024) 20 tablet 0   ibuprofen  (ADVIL ) 800 MG tablet Take 800 mg by mouth every 8 (eight) hours as needed. (Patient not taking: Reported on 09/19/2024)     No current facility-administered medications on file prior to visit.  [2]  Allergies Allergen Reactions   Cat Dander     Hives   Shellfish Allergy    Short Ragweed Pollen Ext Other (See Comments)    Eyes water   Sulfa Antibiotics     Spikes temp

## 2024-09-19 NOTE — Telephone Encounter (Signed)
 Pt. Stated Auth release for surgery was rcvd by other office [id number] ext [phone] and they need another Release sent today or Appt will be cncld

## 2024-09-19 NOTE — Telephone Encounter (Signed)
 Pt lvm stating that he received a call from the surgeon and the authorization that was received stated: Not Authorized. Requested a call back.  PH: [phone]

## 2024-09-19 NOTE — Telephone Encounter (Signed)
 Sherry lvm stating that the office received a clearance form. She stated pt was seen today. Joen has requested a call back asap.   PH: [phone]

## 2024-09-19 NOTE — Telephone Encounter (Signed)
 Jorge Bailey called asking if we can have clearance sent in with the not cleared not checked on it, so they schedule him in January. They cancelled pt surgery because of the check mark ,

## 2024-09-21 ENCOUNTER — Encounter: Payer: Self-pay | Admitting: Neurology

## 2024-09-22 NOTE — Telephone Encounter (Signed)
 Done, thanks

## 2024-09-22 NOTE — Telephone Encounter (Signed)
 Clearance has been faxed.

## 2024-09-26 ENCOUNTER — Encounter: Payer: Self-pay | Admitting: Urology

## 2024-09-26 ENCOUNTER — Ambulatory Visit: Admitting: Urology

## 2024-09-26 VITALS — BP 122/87 | HR 74

## 2024-09-26 DIAGNOSIS — R972 Elevated prostate specific antigen [PSA]: Secondary | ICD-10-CM

## 2024-09-26 LAB — URINALYSIS, ROUTINE W REFLEX MICROSCOPIC
Bilirubin, UA: NEGATIVE
Glucose, UA: NEGATIVE
Ketones, UA: NEGATIVE
Leukocytes,UA: NEGATIVE
Nitrite, UA: NEGATIVE
Protein,UA: NEGATIVE
RBC, UA: NEGATIVE
Specific Gravity, UA: 1.01 (ref 1.005–1.030)
Urobilinogen, Ur: 0.2 mg/dL (ref 0.2–1.0)
pH, UA: 6 (ref 5.0–7.5)

## 2024-09-26 NOTE — Progress Notes (Signed)
 "  09/26/2024 11:06 AM   Jorge Bailey 1956-04-04 [id number]  Referring provider: Joeann Browning, FNP [address] [place],  [place] [address]  Elevated PSA   HPI: Jorge Bailey is a 68yo here for evaluation  of elevated PSA. PSA was 4.5 this year and 4.1 last year. IPSS 5 QOl 1 on BPh therapy. He has nocturia 2x. Uirne stream strong. No straining to urinate. He finds he has to double void in the morning with the first urination. No family hx of prostate cancer.    PMH: Past Medical History:  Diagnosis Date   Chest pain    July, 2012   GERD (gastroesophageal reflux disease)     Surgical History: No past surgical history on file.  Home Medications:  Allergies as of 09/26/2024       Reactions   Cat Dander    Hives   Shellfish Allergy    Short Ragweed Pollen Ext Other (See Comments)   Eyes water   Sulfa Antibiotics    Spikes temp        Medication List        Accurate as of September 26, 2024 11:06 AM. If you have any questions, ask your nurse or doctor.          acetaminophen  325 MG tablet Commonly known as: TYLENOL  Take 650 mg by mouth every 6 (six) hours as needed.   ALLEGRA PO Take by mouth.   aspirin EC 81 MG tablet Take 81 mg by mouth daily. Swallow whole.   esomeprazole 40 MG capsule Commonly known as: NEXIUM Take 40 mg by mouth daily before breakfast.   HYDROcodone -acetaminophen  5-325 MG tablet Commonly known as: NORCO/VICODIN Take 1 tablet by mouth every 6 (six) hours as needed.   ibuprofen  800 MG tablet Commonly known as: ADVIL  Take 800 mg by mouth every 8 (eight) hours as needed.   Valtoco  10 MG Dose 10 MG/0.1ML Liqd Generic drug: diazePAM  Place 7.5 mg into the nose as needed (for seizure like activity lasting longer than 5 minutes). Placed 7.5 mg into each nostril of the nose as needed for seizure-like activity lasting longer than 5 minutes        Allergies: Allergies[1]  Family History: Family History  Problem Relation Age  of Onset   Heart failure Father     Social History:  reports that he has never smoked. He has never used smokeless tobacco. He reports that he does not drink alcohol and does not use drugs.  ROS: All other review of systems were reviewed and are negative except what is noted above in HPI  Physical Exam: BP 122/87   Pulse 74   Constitutional:  Alert and oriented, No acute distress. HEENT: [place] AT, moist mucus membranes.  Trachea midline, no masses. Cardiovascular: No clubbing, cyanosis, or edema. Respiratory: Normal respiratory effort, no increased work of breathing. GI: Abdomen is soft, nontender, nondistended, no abdominal masses GU: No CVA tenderness. Circumcised phallus. No masses/lesions on penis, testis, scrotum. Prostate 40g smooth no nodules no induration.  Lymph: No cervical or inguinal lymphadenopathy. Skin: No rashes, bruises or suspicious lesions. Neurologic: Grossly intact, no focal deficits, moving all 4 extremities. Psychiatric: Normal mood and affect.  Laboratory Data: Lab Results  Component Value Date   WBC 9.1 06/22/2024   HGB 13.1 06/22/2024   HCT 39.3 06/22/2024   MCV 101.6 (H) 06/22/2024   PLT PLATELET CLUMPS NOTED ON SMEAR, UNABLE TO ESTIMATE 06/22/2024    Lab Results  Component Value Date  CREATININE 0.85 06/22/2024    No results found for: PSA  No results found for: TESTOSTERONE  No results found for: HGBA1C  Urinalysis    Component Value Date/Time   COLORURINE YELLOW 06/22/2024 1909   APPEARANCEUR CLEAR 06/22/2024 1909   LABSPEC 1.014 06/22/2024 1909   PHURINE 5.0 06/22/2024 1909   GLUCOSEU NEGATIVE 06/22/2024 1909   HGBUR NEGATIVE 06/22/2024 1909   BILIRUBINUR NEGATIVE 06/22/2024 1909   KETONESUR 5 (A) 06/22/2024 1909   PROTEINUR 100 (A) 06/22/2024 1909   NITRITE NEGATIVE 06/22/2024 1909   LEUKOCYTESUR NEGATIVE 06/22/2024 1909    Lab Results  Component Value Date   BACTERIA NONE SEEN 06/22/2024    Pertinent Imaging:  No  results found for this or any previous visit.  No results found for this or any previous visit.  No results found for this or any previous visit.  No results found for this or any previous visit.  No results found for this or any previous visit.  No results found for this or any previous visit.  No results found for this or any previous visit.  Results for orders placed during the hospital encounter of 04/10/20  CT Renal Stone Study  Narrative CLINICAL DATA:  Left flank pain with nausea vomiting  EXAM: CT ABDOMEN AND PELVIS WITHOUT CONTRAST  TECHNIQUE: Multidetector CT imaging of the abdomen and pelvis was performed following the standard protocol without IV contrast.  COMPARISON:  None.  FINDINGS: Lower chest: No acute abnormality.  Hepatobiliary: No focal liver abnormality is seen. No gallstones, gallbladder wall thickening, or biliary dilatation.  Pancreas: Unremarkable. No pancreatic ductal dilatation or surrounding inflammatory changes.  Spleen: Normal in size without focal abnormality.  Adrenals/Urinary Tract: The bilateral adrenal glands are normal. A normal right kidney is not seen. It is either absent or markedly atrophic. There are multiple nonobstructing stones in the left kidney. There is moderate left hydroureteronephrosis due to obstruction by a 2 mm stone in the left ureteral vesicular junction. The bladder is partially decompressed.  Stomach/Bowel: Stomach is within normal limits. Appendix is not seen but no inflammation is noted around cecum. No evidence of bowel wall thickening, distention, or inflammatory changes.  Vascular/Lymphatic: Aortic atherosclerosis. No enlarged abdominal or pelvic lymph nodes.  Reproductive: Prostate gland is normal.  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  Musculoskeletal: Degenerative joint changes of the spine are noted.  IMPRESSION: 1. Moderate left hydroureteronephrosis due to  obstruction by a 2 mm stone in the left ureteral vesicular junction. 2. A normal right kidney is not seen. It is either absent or markedly atrophic. 3. Aortic atherosclerosis.  Aortic Atherosclerosis (ICD10-I70.0).   Electronically Signed By: Craig Farr M.D. On: 04/10/2020 10:29   Assessment & Plan:    1. Elevated PSA (Primary) IsoPSa, will call with results. If elevated we will proceed with prostate MRI. If normal I will see him back in 1 year with a PSA - Urinalysis, Routine w reflex microscopic; Future - Urinalysis, Routine w reflex microscopic   No follow-ups on file.  Belvie Clara, MD  [facility] [place]      [1]  Allergies Allergen Reactions   Cat Dander     Hives   Shellfish Allergy    Short Ragweed Pollen Ext Other (See Comments)    Eyes water   Sulfa Antibiotics     Spikes temp   "

## 2024-09-26 NOTE — Patient Instructions (Signed)

## 2024-10-01 ENCOUNTER — Telehealth: Payer: Self-pay

## 2024-10-01 DIAGNOSIS — R972 Elevated prostate specific antigen [PSA]: Secondary | ICD-10-CM

## 2024-10-01 NOTE — Telephone Encounter (Signed)
" ° °  ISO routed to MD, McKenzie for review   "

## 2024-10-03 ENCOUNTER — Ambulatory Visit: Admitting: Neurology

## 2024-10-14 NOTE — Telephone Encounter (Signed)
 Tried calling pt with no answer. LVM for return call to office. Prostate MRI order in

## 2024-10-14 NOTE — Telephone Encounter (Signed)
 Prostate mRI

## 2024-10-16 ENCOUNTER — Ambulatory Visit: Admit: 2024-10-16 | Admitting: Orthopedic Surgery

## 2024-10-16 SURGERY — ARTHROPLASTY, HIP, TOTAL, ANTERIOR APPROACH
Anesthesia: Spinal | Site: Hip | Laterality: Right

## 2024-10-28 ENCOUNTER — Ambulatory Visit

## 2024-10-28 ENCOUNTER — Other Ambulatory Visit (HOSPITAL_COMMUNITY)

## 2024-10-28 ENCOUNTER — Ambulatory Visit (HOSPITAL_COMMUNITY)
Admission: RE | Admit: 2024-10-28 | Discharge: 2024-10-28 | Disposition: A | Discharge status: Home / Self Care | Source: Ambulatory Visit | Attending: Urology | Admitting: Urology

## 2024-10-28 DIAGNOSIS — R972 Elevated prostate specific antigen [PSA]: Secondary | ICD-10-CM | POA: Diagnosis present

## 2024-10-28 DIAGNOSIS — I639 Cerebral infarction, unspecified: Secondary | ICD-10-CM | POA: Diagnosis present

## 2024-10-28 MED ORDER — GADOBUTROL 1 MMOL/ML IV SOLN
8.0000 mL | Freq: Once | INTRAVENOUS | Status: AC | PRN
  Administered 2024-10-28: 8 mL via INTRAVENOUS

## 2024-11-10 DIAGNOSIS — I639 Cerebral infarction, unspecified: Secondary | ICD-10-CM

## 2024-11-11 ENCOUNTER — Other Ambulatory Visit: Payer: Self-pay | Admitting: Surgery

## 2024-11-12 ENCOUNTER — Ambulatory Visit: Admission: RE | Admit: 2024-11-12 | Discharge: 2024-11-13 | Disposition: A | Attending: Surgery | Admitting: Surgery

## 2024-11-12 ENCOUNTER — Encounter: Payer: Self-pay | Admitting: Surgery

## 2024-11-12 ENCOUNTER — Encounter: Admission: RE | Disposition: A | Payer: Self-pay | Attending: Surgery

## 2024-11-12 ENCOUNTER — Other Ambulatory Visit: Payer: Self-pay

## 2024-11-12 ENCOUNTER — Ambulatory Visit

## 2024-11-12 ENCOUNTER — Ambulatory Visit: Admitting: Anesthesiology

## 2024-11-12 DIAGNOSIS — Z419 Encounter for procedure for purposes other than remedying health state, unspecified: Secondary | ICD-10-CM

## 2024-11-12 DIAGNOSIS — Z96641 Presence of right artificial hip joint: Secondary | ICD-10-CM

## 2024-11-12 DIAGNOSIS — Z01818 Encounter for other preprocedural examination: Secondary | ICD-10-CM

## 2024-11-12 LAB — COMPREHENSIVE METABOLIC PANEL WITH GFR
ALT: 19 U/L (ref 0–44)
AST: 22 U/L (ref 15–41)
Albumin: 4.7 g/dL (ref 3.5–5.0)
Alkaline Phosphatase: 47 U/L (ref 38–126)
Anion gap: 11 (ref 5–15)
BUN: 17 mg/dL (ref 8–23)
CO2: 27 mmol/L (ref 22–32)
Calcium: 9.6 mg/dL (ref 8.9–10.3)
Chloride: 103 mmol/L (ref 98–111)
Creatinine, Ser: 0.83 mg/dL (ref 0.61–1.24)
GFR, Estimated: >60 mL/min
Glucose, Bld: 96 mg/dL (ref 70–99)
Potassium: 4.3 mmol/L (ref 3.5–5.1)
Sodium: 141 mmol/L (ref 135–145)
Total Bilirubin: 0.8 mg/dL (ref 0.0–1.2)
Total Protein: 7.3 g/dL (ref 6.5–8.1)

## 2024-11-12 LAB — CBC WITH DIFFERENTIAL/PLATELET
Abs Immature Granulocytes: 0.02 10*3/uL (ref 0.00–0.07)
Basophils Absolute: 0.1 10*3/uL (ref 0.0–0.1)
Basophils Relative: 1 %
Eosinophils Absolute: 0.3 10*3/uL (ref 0.0–0.5)
Eosinophils Relative: 4 %
HCT: 44.2 % (ref 39.0–52.0)
Hemoglobin: 15.2 g/dL (ref 13.0–17.0)
Immature Granulocytes: 0 %
Lymphocytes Relative: 21 %
Lymphs Abs: 1.6 10*3/uL (ref 0.7–4.0)
MCH: 34.5 pg — ABNORMAL HIGH (ref 26.0–34.0)
MCHC: 34.4 g/dL (ref 30.0–36.0)
MCV: 100.5 fL — ABNORMAL HIGH (ref 80.0–100.0)
Monocytes Absolute: 0.5 10*3/uL (ref 0.1–1.0)
Monocytes Relative: 7 %
Neutro Abs: 5.1 10*3/uL (ref 1.7–7.7)
Neutrophils Relative %: 67 %
Platelets: 253 10*3/uL (ref 150–400)
RBC: 4.4 MIL/uL (ref 4.22–5.81)
RDW: 12.2 % (ref 11.5–15.5)
WBC: 7.6 10*3/uL (ref 4.0–10.5)
nRBC: 0 % (ref 0.0–0.2)

## 2024-11-12 LAB — TYPE AND SCREEN
ABO/RH(D): A POS
Antibody Screen: POSITIVE

## 2024-11-12 LAB — ABO/RH: ABO/RH(D): A POS

## 2024-11-12 MED ORDER — SODIUM CHLORIDE 0.9 % IV SOLN: INTRAVENOUS | Status: DC

## 2024-11-12 MED ORDER — ACETAMINOPHEN 500 MG PO TABS
1000.0000 mg | ORAL_TABLET | Freq: Four times a day (QID) | ORAL | Status: DC
  Administered 2024-11-12 – 2024-11-13 (×2): 1000 mg via ORAL
  Filled 2024-11-12 (×2): qty 2

## 2024-11-12 MED ORDER — DEXMEDETOMIDINE HCL IN NACL 80 MCG/20ML IV SOLN
INTRAVENOUS | Status: DC | PRN
  Administered 2024-11-12: 8 ug via INTRAVENOUS
  Administered 2024-11-12: 4 ug via INTRAVENOUS

## 2024-11-12 MED ORDER — BISACODYL 10 MG RE SUPP: 10.0000 mg | Freq: Every day | RECTAL | Status: DC | PRN

## 2024-11-12 MED ORDER — KETOROLAC TROMETHAMINE 15 MG/ML IJ SOLN
INTRAMUSCULAR | Status: AC
  Filled 2024-11-12: qty 1

## 2024-11-12 MED ORDER — MIDAZOLAM HCL 5 MG/5ML IJ SOLN
INTRAMUSCULAR | Status: DC | PRN
  Administered 2024-11-12: 2 mg via INTRAVENOUS

## 2024-11-12 MED ORDER — TRANEXAMIC ACID-NACL 1000-0.7 MG/100ML-% IV SOLN
INTRAVENOUS | Status: DC | PRN
  Administered 2024-11-12: 1000 mg via INTRAVENOUS

## 2024-11-12 MED ORDER — CEFAZOLIN SODIUM-DEXTROSE 2-4 GM/100ML-% IV SOLN
INTRAVENOUS | Status: AC
  Filled 2024-11-12: qty 100

## 2024-11-12 MED ORDER — PROPOFOL 10 MG/ML IV BOLUS
INTRAVENOUS | Status: DC | PRN
  Administered 2024-11-12: 30 mg via INTRAVENOUS
  Administered 2024-11-12: 40 mg via INTRAVENOUS

## 2024-11-12 MED ORDER — ONDANSETRON HCL 4 MG/2ML IJ SOLN: 4.0000 mg | Freq: Four times a day (QID) | INTRAMUSCULAR | Status: DC | PRN

## 2024-11-12 MED ORDER — SODIUM CHLORIDE (PF) 0.9 % IJ SOLN
INTRAMUSCULAR | Status: AC
  Filled 2024-11-12: qty 40

## 2024-11-12 MED ORDER — APIXABAN 2.5 MG PO TABS
2.5000 mg | ORAL_TABLET | Freq: Two times a day (BID) | ORAL | Status: DC
  Filled 2024-11-12: qty 1

## 2024-11-12 MED ORDER — DROPERIDOL 2.5 MG/ML IJ SOLN
INTRAMUSCULAR | Status: AC
  Filled 2024-11-12: qty 2

## 2024-11-12 MED ORDER — CHLORHEXIDINE GLUCONATE 0.12 % MT SOLN
OROMUCOSAL | Status: AC
  Filled 2024-11-12: qty 15

## 2024-11-12 MED ORDER — BUPIVACAINE HCL (PF) 0.5 % IJ SOLN
INTRAMUSCULAR | Status: DC | PRN
  Administered 2024-11-12: 2.8 mL

## 2024-11-12 MED ORDER — PROPOFOL 1000 MG/100ML IV EMUL
INTRAVENOUS | Status: AC
  Filled 2024-11-12: qty 100

## 2024-11-12 MED ORDER — TRANEXAMIC ACID-NACL 1000-0.7 MG/100ML-% IV SOLN: 1000.0000 mg | INTRAVENOUS | Status: DC

## 2024-11-12 MED ORDER — TRIAMCINOLONE ACETONIDE 40 MG/ML IJ SUSP
INTRAMUSCULAR | Status: AC
  Filled 2024-11-12: qty 2

## 2024-11-12 MED ORDER — TRANEXAMIC ACID-NACL 1000-0.7 MG/100ML-% IV SOLN
INTRAVENOUS | Status: AC
  Filled 2024-11-12: qty 100

## 2024-11-12 MED ORDER — 0.9 % SODIUM CHLORIDE (POUR BTL) OPTIME
TOPICAL | Status: DC | PRN
  Administered 2024-11-12: 500 mL

## 2024-11-12 MED ORDER — FLEET ENEMA RE ENEM: 1.0000 | ENEMA | Freq: Once | RECTAL | Status: DC | PRN

## 2024-11-12 MED ORDER — KETOROLAC TROMETHAMINE 15 MG/ML IJ SOLN
15.0000 mg | Freq: Once | INTRAMUSCULAR | Status: AC
  Administered 2024-11-12: 15 mg via INTRAVENOUS

## 2024-11-12 MED ORDER — PANTOPRAZOLE SODIUM 40 MG PO TBEC
40.0000 mg | DELAYED_RELEASE_TABLET | Freq: Every day | ORAL | Status: DC
  Administered 2024-11-13: 40 mg via ORAL
  Filled 2024-11-12: qty 1

## 2024-11-12 MED ORDER — MAGNESIUM HYDROXIDE 400 MG/5ML PO SUSP: 30.0000 mL | Freq: Every day | ORAL | Status: DC | PRN

## 2024-11-12 MED ORDER — CEFAZOLIN SODIUM-DEXTROSE 2-4 GM/100ML-% IV SOLN
2.0000 g | INTRAVENOUS | Status: AC
  Administered 2024-11-12: 2 g via INTRAVENOUS

## 2024-11-12 MED ORDER — ONDANSETRON HCL 4 MG/2ML IJ SOLN
INTRAMUSCULAR | Status: AC
  Filled 2024-11-12: qty 2

## 2024-11-12 MED ORDER — OXYCODONE HCL 5 MG PO TABS: 5.0000 mg | ORAL_TABLET | ORAL | Status: DC | PRN

## 2024-11-12 MED ORDER — BUPIVACAINE HCL (PF) 0.5 % IJ SOLN
INTRAMUSCULAR | Status: AC
  Filled 2024-11-12: qty 10

## 2024-11-12 MED ORDER — DROPERIDOL 2.5 MG/ML IJ SOLN
0.6250 mg | Freq: Once | INTRAMUSCULAR | Status: AC | PRN
  Administered 2024-11-12: 0.625 mg via INTRAVENOUS

## 2024-11-12 MED ORDER — METOCLOPRAMIDE HCL 5 MG/ML IJ SOLN: 5.0000 mg | Freq: Three times a day (TID) | INTRAMUSCULAR | Status: DC | PRN

## 2024-11-12 MED ORDER — SODIUM CHLORIDE 0.9 % IR SOLN
Status: DC | PRN
  Administered 2024-11-12: 3000 mL

## 2024-11-12 MED ORDER — ACETAMINOPHEN 10 MG/ML IV SOLN
INTRAVENOUS | Status: DC | PRN
  Administered 2024-11-12: 1000 mg via INTRAVENOUS

## 2024-11-12 MED ORDER — ONDANSETRON HCL 4 MG/2ML IJ SOLN
INTRAMUSCULAR | Status: DC | PRN
  Administered 2024-11-12: 4 mg via INTRAVENOUS

## 2024-11-12 MED ORDER — PHENYLEPHRINE HCL-NACL 20-0.9 MG/250ML-% IV SOLN
INTRAVENOUS | Status: AC
  Filled 2024-11-12: qty 250

## 2024-11-12 MED ORDER — ORAL CARE MOUTH RINSE: 15.0000 mL | Freq: Once | OROMUCOSAL | Status: DC

## 2024-11-12 MED ORDER — ONDANSETRON HCL 4 MG PO TABS: 4.0000 mg | ORAL_TABLET | Freq: Four times a day (QID) | ORAL | Status: DC | PRN

## 2024-11-12 MED ORDER — LORATADINE 10 MG PO TABS
10.0000 mg | ORAL_TABLET | Freq: Every day | ORAL | Status: DC
  Administered 2024-11-13: 10 mg via ORAL
  Filled 2024-11-12: qty 1

## 2024-11-12 MED ORDER — CHLORHEXIDINE GLUCONATE 0.12 % MT SOLN: 15.0000 mL | Freq: Once | OROMUCOSAL | Status: DC

## 2024-11-12 MED ORDER — MIDAZOLAM HCL 2 MG/2ML IJ SOLN
INTRAMUSCULAR | Status: AC
  Filled 2024-11-12: qty 2

## 2024-11-12 MED ORDER — ACETAMINOPHEN 325 MG PO TABS: 325.0000 mg | ORAL_TABLET | Freq: Four times a day (QID) | ORAL | Status: DC | PRN

## 2024-11-12 MED ORDER — DIPHENHYDRAMINE HCL 12.5 MG/5ML PO ELIX: 12.5000 mg | ORAL_SOLUTION | ORAL | Status: DC | PRN

## 2024-11-12 MED ORDER — PHENYLEPHRINE HCL-NACL 20-0.9 MG/250ML-% IV SOLN
INTRAVENOUS | Status: DC | PRN
  Administered 2024-11-12: 20 ug/min via INTRAVENOUS

## 2024-11-12 MED ORDER — SENNA 8.6 MG PO TABS
1.0000 | ORAL_TABLET | Freq: Two times a day (BID) | ORAL | Status: DC
  Administered 2024-11-13: 8.6 mg via ORAL
  Filled 2024-11-12 (×2): qty 1

## 2024-11-12 MED ORDER — METOCLOPRAMIDE HCL 10 MG PO TABS: 5.0000 mg | ORAL_TABLET | Freq: Three times a day (TID) | ORAL | Status: DC | PRN

## 2024-11-12 MED ORDER — TRIAMCINOLONE ACETONIDE 40 MG/ML IJ SUSP
INTRAMUSCULAR | Status: DC | PRN
  Administered 2024-11-12: 93 mL

## 2024-11-12 MED ORDER — PROPOFOL 500 MG/50ML IV EMUL
INTRAVENOUS | Status: DC | PRN
  Administered 2024-11-12: 125 ug/kg/min via INTRAVENOUS

## 2024-11-12 MED ORDER — OXYCODONE HCL 5 MG PO TABS: 5.0000 mg | ORAL_TABLET | Freq: Once | ORAL | Status: DC | PRN

## 2024-11-12 MED ORDER — BUPIVACAINE-EPINEPHRINE (PF) 0.5% -1:200000 IJ SOLN
INTRAMUSCULAR | Status: AC
  Filled 2024-11-12: qty 30

## 2024-11-12 MED ORDER — LACTATED RINGERS IV SOLN: INTRAVENOUS | Status: DC

## 2024-11-12 MED ORDER — ACETAMINOPHEN 10 MG/ML IV SOLN: 1000.0000 mg | Freq: Once | INTRAVENOUS | Status: DC | PRN

## 2024-11-12 MED ORDER — ACETAMINOPHEN 10 MG/ML IV SOLN
INTRAVENOUS | Status: AC
  Filled 2024-11-12: qty 100

## 2024-11-12 MED ORDER — KETOROLAC TROMETHAMINE 15 MG/ML IJ SOLN
7.5000 mg | Freq: Four times a day (QID) | INTRAMUSCULAR | Status: DC
  Administered 2024-11-12 – 2024-11-13 (×3): 7.5 mg via INTRAVENOUS
  Filled 2024-11-12 (×2): qty 1

## 2024-11-12 MED ORDER — FENTANYL CITRATE (PF) 100 MCG/2ML IJ SOLN: 25.0000 ug | INTRAMUSCULAR | Status: DC | PRN

## 2024-11-12 MED ORDER — DEXAMETHASONE SOD PHOSPHATE PF 10 MG/ML IJ SOLN
INTRAMUSCULAR | Status: AC
  Filled 2024-11-12: qty 1

## 2024-11-12 MED ORDER — CEFAZOLIN SODIUM-DEXTROSE 2-4 GM/100ML-% IV SOLN
2.0000 g | Freq: Four times a day (QID) | INTRAVENOUS | Status: AC
  Administered 2024-11-12 – 2024-11-13 (×2): 2 g via INTRAVENOUS
  Filled 2024-11-12 (×2): qty 100

## 2024-11-12 MED ORDER — OXYCODONE HCL 5 MG/5ML PO SOLN: 5.0000 mg | Freq: Once | ORAL | Status: DC | PRN

## 2024-11-12 MED ORDER — DEXAMETHASONE SOD PHOSPHATE PF 10 MG/ML IJ SOLN
INTRAMUSCULAR | Status: DC | PRN
  Administered 2024-11-12: 10 mg via INTRAVENOUS

## 2024-11-12 MED ORDER — BUPIVACAINE LIPOSOME 1.3 % IJ SUSP
INTRAMUSCULAR | Status: AC
  Filled 2024-11-12: qty 20

## 2024-11-12 NOTE — Transfer of Care (Signed)
 Immediate Anesthesia Transfer of Care Note  Patient: Jorge Bailey  Procedure(s) Performed: ARTHROPLASTY, HIP, TOTAL,POSTERIOR APPROACH (Right: Hip)  Patient Location: PACU  Anesthesia Type:Spinal  Level of Consciousness: awake, alert , and oriented  Airway & Oxygen Therapy: Patient Spontanous Breathing  Post-op Assessment: Report given to RN and Post -op Vital signs reviewed and stable  Post vital signs: Reviewed and stable  Last Vitals:  Vitals Value Taken Time  BP 122/88 11/12/24 16:30  Temp    Pulse 63 11/12/24 16:34  Resp 14 11/12/24 16:34  SpO2 100 % 11/12/24 16:34  Vitals shown include unfiled device data.  Last Pain:  Vitals:   11/12/24 1246  TempSrc: Temporal  PainSc: 4          Complications: No notable events documented.

## 2024-11-12 NOTE — Discharge Instructions (Signed)
 Instructions after Total Hip Replacement     J. Reyes Maltos, M.D.  DOROTHA Gustavo Level, PA-C     Dept. of Orthopaedics & Sports Medicine  [facility]  [address]  [place], [place]  [address]  Phone: [phone]   Fax: [phone]    DIET: Drink plenty of non-alcoholic fluids. Resume your normal diet. Include foods high in fiber.  ACTIVITY:  You may use crutches or a walker with weight-bearing as tolerated, unless instructed otherwise. You may be weaned off of the walker or crutches by your Physical Therapist.  Do NOT reach below the level of your knees or cross your legs until allowed.    Continue doing gentle exercises. Exercising will reduce the pain and swelling, increase motion, and prevent muscle weakness.   Please continue to use the TED compression stockings for 6 weeks. You may remove the stockings at night, but should reapply them in the morning. Do not drive or operate any equipment until instructed.  WOUND CARE:  Continue to use ice packs periodically to reduce pain and swelling. Keep the incision clean and dry. You may bathe or shower after the staples are removed at the first office visit following surgery.  MEDICATIONS: You may resume your regular medications. Please take the pain medication as prescribed on the medication. Do not take pain medication on an empty stomach. You have been given a prescription for a blood thinner to prevent blood clots. Please take the medication as instructed. (NOTE: After completing a 2 week course of Eliquis , plan will be to transition to aspirin .) Pain medications and iron supplements can cause constipation. Use a stool softener (Senokot or Colace) on a daily basis and a laxative (dulcolax or miralax) as needed. Do not drive or drink alcoholic beverages when taking pain medications.  CALL THE OFFICE FOR: Temperature above 101 degrees Excessive bleeding or drainage on the dressing. Excessive swelling, coldness, or  paleness of the toes. Persistent nausea and vomiting.  FOLLOW-UP:  You should have an appointment to return to the office in 2 weeks after surgery. Arrangements have been made for continuation of Physical Therapy (either home therapy or outpatient therapy).

## 2024-11-12 NOTE — H&P (Signed)
 History of Present Illness: Jorge Bailey is a 69 y.o. male who presents today for evaluation of chronic ongoing right hip pain. The patient reports that he began to experience pain in February 2025 without any known trauma or injury. The patient was in the [place] for over 20 years and has been very active throughout his life. The patient did undergo x-rays which demonstrated significant right hip osteoarthritic changes, he did undergo a right intra-articular hip injection early last year which did provide mild relief but the pain did gradually return. He has not suffered any trauma or injury that he is aware of. He reports the pain is an aching and throbbing discomfort along the lateral aspect of the right hip with radiation into the right groin and occasionally into the right knee. He also has a history of right knee osteoarthritic changes. At 1 point the patient was scheduled to undergo a right total hip arthroplasty with [facility] however the patient did suffer a seizure-like event in September of last year. The patient has followed up with neurology in addition to cardiology. He did undergo a MRI scan of the brain in addition to CT scan and EEG. 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 He was advised to wait at least 3 months from the date of the initial seizure/strokelike event if possible preferably at least 6 months. The patient is not close to 5 months. He reports that the right hip discomfort is severe and is limiting his ability to do his day-to-day tasks. He reports that he has difficulty standing or walking for prolonged periods of time. He does report difficulty with getting up from seated position but once he is able to stand or walk he does report that the pain is  improved. He is on 81 mg aspirin  daily. The patient denies any history of heart attack, asthma, COPD, diabetes or DVT. He would like to proceed with more aggressive treatment at this time.  Past Medical History: Arthritis  GERD (gastroesophageal reflux disease)  Hearing loss  Spinal stenosis   Past Surgical History: ARTHROPLASTY HIP TOTAL Left 11/17/2013  Procedure: ARTHROPLASTY HIP TOTAL; Surgeon: Glendia Trenda Lawrence, MD; Location: [facility] OR; Service: Orthopedics; Laterality: Left;  REPAIR INGUINAL HERNIA  TONSILLECTOMY   Past Family History: Leukemia Mother  Hearing loss Father  Heart disease Father  Allergies Neg Hx  Anesthesia problems Neg Hx  Asthma Neg Hx  Cancer Neg Hx  Clotting disorder Neg Hx  Diabetes Neg Hx  Neurological disorder Neg Hx  Thyroid disease Neg Hx   Medications: ascorbic acid, vitamin C, (VITAMIN C) 250 MG tablet Take 0.25 mg by mouth every morning before breakfast (0630)  ferrous sulfate 325 (65 FE) MG tablet Take 325 mg by mouth every morning before breakfast  VALTOCO  10 mg/spray (0.1 mL) nasal spray Place 7.5 mg into the nose as needed (for seizure like activity lasting longer than 5 minutes). Placed 7.5 mg into each nostril of the nose as needed for seizure-like activity lasting longer than 5 minutes  acetaminophen  (TYLENOL ) 325 MG tablet Take 650 mg by mouth every 6 (six) hours as needed  cetirizine (ZYRTEC) 10 MG tablet TAKE ONE TABLET BY MOUTH EVERY DAY FOR ALLERGIES  esomeprazole (NEXIUM) 40 MG DR capsule Take 40 mg by mouth once daily. Takes nexium  1 tablet in the morning daily. May take 1 tablet in the evening prn  ferrous gluconate (FERGON) 324 mg (37.5 mg iron) Tab tablet Take 1 tablet (324 mg total) by mouth 2 (two) times daily with meals.  fluticasone propionate (FLONASE) 50 mcg/actuation nasal spray Place 1 spray into both nostrils once daily  ibuprofen  (ADVIL ,MOTRIN ) 200 MG tablet Take 200 mg by mouth every 6 (six) hours as needed for Pain.   multivitamin tablet Take 1 tablet by mouth daily.  multivitamin with iron (STRESS FORMULA) Tab tablet Take 1 tablet by mouth once daily  traMADol (ULTRAM) 50 mg tablet Take 1 tablet (50 mg total) by mouth every 6 (six) hours as needed for Pain. 90 tablet 0  zinc sulfate (ZINCATE) 50 mg zinc (220 mg) capsule Take 220 mg by mouth once daily   Allergies: Sulfa (Sulfonamide Antibiotics) Other (Fever)  Review of Systems:  A comprehensive 14 point ROS was performed, reviewed by me today, and the pertinent orthopaedic findings are documented in the HPI.  Physical Exam: BP (!) 146/84  Ht 175.3 cm (5' 9)  Wt 81.7 kg (180 lb 3.2 oz)  BMI 26.61 kg/m  General/Constitutional: The patient appears to be well-nourished, well-developed, and in no acute distress. Neuro/Psych: Normal mood and affect, oriented to person, place and time. Eyes: Non-icteric. Pupils are equal, round, and reactive to light, and exhibit synchronous movement. ENT: Unremarkable. Lymphatic: No palpable adenopathy. Respiratory: Lungs clear to auscultation, Normal chest excursion, No wheezes, and Non-labored breathing Cardiovascular: Regular rate and rhythm. No murmurs. and No edema, swelling or tenderness, except as noted in detailed exam. Integumentary: No impressive skin lesions present, except as noted in detailed exam. Musculoskeletal: Unremarkable, except as noted in detailed exam.  Right hip exam: The patient presents today with a moderate limp favoring the right leg. He is not using a walker or cane or any assistive devices at this time. He is able to gently flex and extend at the lumbar spine, limitations with extension. The patient does have increased pain with logroll maneuver of the right leg. The patient has significant limitations with both internal and external rotation of the right hip. Positive Deri and FADIR testing. Shortening of the right leg compared to the left is identified. The patient is intact light touch  to bilateral lower extremities. Cap refills intact to each individual toe. Dorsalis pedis and posterior tibialis pulse are intact to bilateral lower extremities.  Imaging: AP and lateral views of the right hip were obtained today in the office and reviewed by me. These x-rays demonstrate severe right hip osteoarthritic changes with complete loss of superior and inferior acetabular joint space. Moderate spurring off of the superior aspect of the femoral head. Moderate subchondral sclerosis. 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 No evidence of acute fracture. No evidence of dislocation.  Impression: 1. Primary osteoarthritis of right hip. 2. Avascular necrosis of bone of right hip. 3. Status post left hip replacement.  Plan:  1. Treatment options were discussed today with the patient. 2. The patient presents today with severe right hip osteoarthritic changes. 3. The patient has undergone intra-articular steroid injections without significant relief. He is quite frustrated by his continued right hip discomfort. 4. The patient would like to consider more aggressive treatment options at this time. The risks and benefits of a right total hip arthroplasty were discussed in detail with the patient at today's appointment. 5. After discussion of the risk  and benefits the patient would like to proceed with surgery at this time. Surgery will be scheduled with Dr. Edie. 6. The patient did have a seizure-like event in September but did undergo cardiac and neurologic evaluation and reports that he was cleared for surgery. Documentation from the neurologist was reviewed, they did recommend possible waiting until 6 months out from his event before proceeding with any nonelective surgery however given his level of pain and limitations we discussed proceeding with surgery at this time and he would like to proceed at this time knowing the  potential increased risk. 7. This document will serve as a surgical history and physical. We discussed that there is a possibility that he can potentially be added to the surgical schedule tomorrow pending or availability time. 8. The patient will follow-up with me per standard postop protocol. They can call the clinic they have any questions, new symptoms develop or symptoms worsen.  The procedure was discussed with the patient, as were the potential risks (including bleeding, infection, nerve and/or blood vessel injury, persistent or recurrent pain, failure of the hardware, dislocation, heterotopic ossification, leg length inequality, need for further surgery, blood clots, strokes, heart attacks and/or arhythmias, pneumonia, etc.) and benefits. The patient states his understanding and wishes to proceed.    H&P reviewed and patient re-examined. No changes.

## 2024-11-12 NOTE — Op Note (Signed)
 11/12/2024  4:17 PM  Patient:   Jorge Bailey  Pre-Op Diagnosis:   Advanced degenerative joint disease, right hip.  Post-Op Diagnosis:   Same.  Procedure:   Right total hip arthroplasty.  Surgeon:   DOROTHA Reyes Maltos, MD  Assistant:   Gustavo Level, PA-C  Anesthesia:   Spinal  Findings:   As above.  Complications:   None  EBL:   200 cc  Fluids:   700 cc crystalloid  UOP:   None  TT:   None  Drains:   None  Closure:   Staples  Implants:   Biomet press-fit system with a # 15 laterally offset Echo femoral stem, a 54 mm acetabular shell with an E-poly hi-wall liner, and a 36 mm ceramic head with a -3 mm neck.  Brief Clinical Note:   The patient is a 69 year old male with a long history of progressively worsening right hip/groin pain. His symptoms have progressed despite medications, activity modification, etc. His history and examination consistent with advanced degenerative joint disease, confirmed by plain radiographs. The patient presents at this time for a right total hip arthroplasty.  Procedure:   The patient was brought into the operating room. After adequate spinal anesthesia was obtained, the patient was repositioned in the left lateral decubitus position and secured using a lateral hip positioner. The right hip and lower extremity were prepped with ChloroPrep solution before being draped sterilely. Preoperative antibiotics were administered. A timeout was performed to verify the appropriate surgical site.    A standard posterior approach to the hip was made through an approximately 6-7 inch incision. The incision was carried down through the subcutaneous tissues to expose the gluteal fascia and proximal end of the iliotibial band. These structures were split the length of the incision and the Charnley self-retaining hip retractor placed. The bursal tissues were swept posteriorly to expose the short external rotators. The anterior border of the piriformis tendon was identified  and this plane developed down through the capsule to enter the joint. A flap of tissue was elevated off the posterior aspect of the femoral neck and greater trochanter and retracted posteriorly. This flap included the piriformis tendon, the short external rotators, and the posterior capsule. The soft tissues were elevated off the lateral aspect of the ilium and a large Steinmann pin placed bicortically.   With the right leg aligned over the left, a drill bit was placed into the greater trochanter parallel to the Steinmann pin and the distance between these two pins measured in order to optimize leg lengths postoperatively. The drill bit was removed and the hip dislocated. The piriformis fossa was debrided of soft tissues before the intramedullary canal was accessed through this point using a triple step reamer. The canal was reamed sequentially beginning with a #7 tapered reamer and progressing to a #15 tapered reamer. This provided excellent circumferential chatter. Using the appropriate guide, a femoral neck cut was made 10-12 mm above the lesser trochanter. The femoral head was removed.  Attention was directed to the acetabular side. The labrum was debrided circumferentially before the ligamentum teres was removed using a large curette. A line was drawn on the drapes corresponding to the native version of the acetabulum. This line was used as a guide while the acetabulum was reamed sequentially beginning with a 47 mm reamer and progressing to a 53 mm reamer. This provided excellent circumferential chatter. The 53 mm trial acetabulum was positioned and found to fit quite well. Therefore, the 54 mm acetabular  shell was selected and impacted into place with care taken to maintain the appropriate version. The trial high wall liner was inserted.  Attention was redirected to the femoral side. A box osteotome was used to establish version before the canal was broached sequentially beginning with a #12 broach and  progressing to a #15 broach. This was left in place and several trial reductions performed using both the standard and laterally offset neck options, as well as the -6 mm and -3 mm neck lengths. After removing the trial components, the manhole cover was placed into the apex of the acetabular shell and tightened securely. The permanent E-polyethylene hi-wall liner was impacted into the acetabular shell and its locking mechanism verified using a quarter-inch osteotome. Next, the #15 laterally offset femoral stem was impacted into place with care taken to maintain the appropriate version. A repeat trial reduction was performed using the -3 mm neck length. The -3 mm neck length demonstrated excellent stability both in extension and external rotation as well as with flexion to 90 and internal rotation beyond 70. It also was stable in the position of sleep. In addition, leg lengths appeared to be restored appropriately, both by reassessing the position of the right leg over the left, as well as by measuring the distance between the Steinmann pin and the drill bit. The 36 mm ceramic head with the -3 mm neck was impacted onto the stem of the femoral component. The Morse taper locking mechanism was verified using manual distraction before the head was relocated and placed through a range of motion with the findings as described above.  The wound was copiously irrigated with sterile saline solution via the jet lavage system before the peri-incisional and pericapsular tissues were injected with a cocktail of 20 cc of Exparel , 30 cc of 0.5% Sensorcaine , 2 cc of Kenalog  40 (80 mg), and 30 mg of Toradol  diluted out to 90 cc with normal saline to help with postoperative analgesia. The posterior flap was reapproximated to the posterior aspect of the greater trochanter using #2 Tycron interrupted sutures placed through drill holes. Several additional #2 Tycron interrupted sutures were used to reinforce this layer of closure.  The iliotibial band was reapproximated using #1 Vicryl interrupted sutures before the gluteal fascia was closed using a running #0 Vicryl suture. The subcutaneous tissues were closed in several layers using 2-0 Vicryl interrupted sutures before the skin was closed using staples. A sterile occlusive dressing was applied to the wound. The patient was then rolled back into the supine position on his hospital bed before being awakened and returned to the recovery room in satisfactory condition after tolerating the procedure well.

## 2024-11-12 NOTE — Anesthesia Procedure Notes (Signed)
 Spinal  Patient location during procedure: OR Start time: 11/12/2024 2:35 PM End time: 11/12/2024 2:35 PM Reason for block: surgical anesthesia  Staffing Performed: other anesthesia staff and resident/CRNA  Authorized by: Myra Lynwood MATSU, MD   Performed by: Lynnley Doddridge, CRNA  Preanesthetic Checklist Completed: patient identified, IV checked, site marked, risks and benefits discussed, surgical consent, monitors and equipment checked, pre-op evaluation and timeout performed Spinal Block Patient position: sitting Prep: Betadine Patient monitoring: heart rate, continuous pulse ox, blood pressure and cardiac monitor Approach: midline Location: L4-5 Injection technique: single-shot Needle Needle type: Whitacre and Introducer  Needle gauge: 24 G Needle length: 9 cm Assessment Sensory level: T6 Events: CSF return  Additional Notes Negative paresthesia. Negative blood return. Positive free-flowing CSF. Expiration date of kit checked and confirmed. Patient tolerated procedure well, without complications.

## 2024-11-12 NOTE — Discharge Summary (Signed)
 " Physician Discharge Summary  Patient ID: Jorge Bailey MRN: [id number] DOB/AGE: 71/21/1957 71 y.o.  Admit date: 11/12/2024 Discharge date: 11/13/2024  Admission Diagnoses:  Chronic right hip pain [M25.551, G89.29] Primary osteoarthritis of right hip [M16.11] Avascular necrosis of bone of right hip (HCC) [M87.051] Status post total hip replacement, right [Z96.641]  Discharge Diagnoses: Patient Active Problem List   Diagnosis Date Noted   Status post total hip replacement, right 11/12/2024   GERD (gastroesophageal reflux disease)    Chest pain     Past Medical History:  Diagnosis Date   Chest pain    July, 2012   GERD (gastroesophageal reflux disease)      Transfusion: None.   Consultants (if any):   Discharged Condition: Improved  Hospital Course: Jorge Bailey is an 69 y.o. male who was admitted 11/12/2024 with a diagnosis of advanced degenerative joint disease of the right hip and went to the operating room on 11/12/2024 and underwent the above named procedures.    Surgeries: Procedures: ARTHROPLASTY, HIP, TOTAL,POSTERIOR APPROACH on 11/12/2024 Patient tolerated the surgery well. Taken to PACU where he was stabilized and then transferred to the post-op recovery area  Started on Aspirin  325mg  every 12 hours. Heels elevated on bed with rolled towels. No evidence of DVT. Negative Homan. Physical therapy started on day #1 for gait training and transfer. OT started day #1 for ADL and assisted devices.  Patient's IV was removed on POD1.  Implants: Biomet press-fit system with a # 15 laterally offset Echo femoral stem, a 54 mm acetabular shell with an E-poly hi-wall liner, and a 36 mm ceramic head with a -3 mm neck.   He was given perioperative antibiotics:  Anti-infectives (From admission, onward)    Start     Dose/Rate Route Frequency Ordered Stop   11/13/24 0600  ceFAZolin  (ANCEF ) IVPB 2g/100 mL premix        2 g 200 mL/hr over 30 Minutes Intravenous On call to O.R. 11/12/24  1225 11/12/24 1439   11/12/24 2030  ceFAZolin  (ANCEF ) IVPB 2g/100 mL premix        2 g 200 mL/hr over 30 Minutes Intravenous Every 6 hours 11/12/24 1636 11/13/24 0300     .  He was given sequential compression devices, early ambulation, and Aspirin  for DVT prophylaxis.  He benefited maximally from the hospital stay and there were no complications.    Recent vital signs:  Vitals:   11/13/24 0427 11/13/24 0741  BP: 106/78 (!) 127/90  Pulse: 83 73  Resp: 18 15  Temp: 98.8 F (37.1 C) 97.8 F (36.6 C)  SpO2: 94% 97%    Recent laboratory studies:  Lab Results  Component Value Date   HGB 11.5 (L) 11/13/2024   HGB 15.2 11/12/2024   HGB 13.1 06/22/2024   Lab Results  Component Value Date   WBC 14.1 (H) 11/13/2024   PLT 238 11/13/2024   No results found for: INR Lab Results  Component Value Date   NA 136 11/13/2024   K 4.1 11/13/2024   CL 104 11/13/2024   CO2 21 (L) 11/13/2024   BUN 20 11/13/2024   CREATININE 0.78 11/13/2024   GLUCOSE 134 (H) 11/13/2024    Discharge Medications:   Allergies as of 11/13/2024       Reactions   Cat Dander    Hives   Shellfish Allergy    Short Ragweed Pollen Ext Other (See Comments)   Eyes water   Sulfa Antibiotics    Spikes temp  Medication List     STOP taking these medications    aspirin  EC 81 MG tablet Replaced by: aspirin  325 MG tablet   HYDROcodone -acetaminophen  5-325 MG tablet Commonly known as: NORCO/VICODIN   ibuprofen  800 MG tablet Commonly known as: ADVIL        TAKE these medications    acetaminophen  500 MG tablet Commonly known as: TYLENOL  Take 2 tablets (1,000 mg total) by mouth every 6 (six) hours as needed for mild pain (pain score 1-3). What changed:  medication strength how much to take reasons to take this   ALLEGRA PO Take by mouth.   aspirin  325 MG tablet Take 1 tablet (325 mg total) by mouth 2 (two) times daily. Replaces: aspirin  EC 81 MG tablet   cetirizine 10 MG  tablet Commonly known as: ZYRTEC Take 10 mg by mouth daily.   esomeprazole 40 MG capsule Commonly known as: NEXIUM Take 40 mg by mouth daily before breakfast.   ketorolac  10 MG tablet Commonly known as: TORADOL  Take 1 tablet (10 mg total) by mouth every 6 (six) hours as needed for moderate pain (pain score 4-6).   ondansetron  4 MG tablet Commonly known as: ZOFRAN  Take 1 tablet (4 mg total) by mouth every 6 (six) hours as needed for nausea.   Valtoco  10 MG Dose 10 MG/0.1ML Liqd Generic drug: diazePAM  Place 7.5 mg into the nose as needed (for seizure like activity lasting longer than 5 minutes). Placed 7.5 mg into each nostril of the nose as needed for seizure-like activity lasting longer than 5 minutes               Durable Medical Equipment  (From admission, onward)           Start     Ordered   11/12/24 1637  DME 3 n 1  Once        11/12/24 1636   11/12/24 1637  DME Walker rolling  Once       Question Answer Comment  Walker: With 5 Inch Wheels   Patient needs a walker to treat with the following condition Status post total hip replacement, right      11/12/24 1636            Diagnostic Studies: DG HIP UNILAT W OR W/O PELVIS 2-3 VIEWS RIGHT Result Date: 11/12/2024 EXAM: 2 or 3 VIEW(S) XRAY OF THE PELVIS AND RIGHT HIP 11/12/2024 05:25:00 PM COMPARISON: None available. CLINICAL HISTORY: Status post total hip replacement, right. FINDINGS: BONES AND JOINTS: SI joints are symmetric. No acute fracture. Bilateral hips demonstrate normal alignment. Right hip arthroplasty with non cemented components in place. Left hip arthroplasty noted. SOFT TISSUES: Skin staples noted. Soft tissue gas consistent with recent surgery. IMPRESSION: 1. Postoperative change of right hip arthroplasty. Electronically signed by: Norman Gatlin MD 11/12/2024 07:59 PM EST RP Workstation: [id number]   CARDIAC EVENT MONITOR Result Date: 11/10/2024 The patient wore the monitor for 28 days starting  September 24, 2024 . Indication: History of CVA The minimum heart rate was 51 bpm, maximum heart rate was 136 bpm, and average heart rate was 75 bpm. Predominant underlying rhythm was Sinus Rhythm.  8 beat run of nonsustained ventricular tachycardia Premature atrial complexes were occasional at 1%. Premature Ventricular complexes were occasional at 1%. No pauses, No AV block and no atrial fibrillation present. Patient triggered events associated with premature ventricular tachycardia. Conclusion: This study shows the following:   1.  Short 8 beat run of nonsustained ventricular tachycardia.  2.  Occasional premature ventricular complexes.   3.  Occasional premature atrial complexes.   MR PROSTATE W WO CONTRAST Result Date: 10/31/2024 CLINICAL DATA:  Elevated PSA. EXAM: MR PROSTATE WITHOUT AND WITH CONTRAST TECHNIQUE: Multiplanar multisequence MRI images were obtained of the pelvis centered about the prostate. Pre and post contrast images were obtained. CONTRAST:  8mL GADAVIST  GADOBUTROL  1 MMOL/ML IV SOLN COMPARISON:  04/10/2020 CT stone study. FINDINGS: Prostate: Demonstrates mild central gland enlargement and heterogeneity, consistent with benign prostatic hyperplasia. No dominant central gland nodule. No areas of masslike peripheral zone T2 hypointensity, restricted diffusion. Mild early post-contrast enhancement throughout the peripheral zone is non masslike, nonspecific but can be seen with prostatitis. Volume: 5.0 x 4.8 x 4.1 cm.  Volume 51.2 cc. Transcapsular spread:  Absent Seminal vesicle involvement: Absent Neurovascular bundle involvement: Absent Pelvic adenopathy: Absent Bone metastasis: Absent Other findings: No significant free fluid. Normal urinary bladder. Left hip arthroplasty. Joint space narrowing, heterogeneity, and synovial hyperenhancement throughout the right hip are likely degenerative including on image 22/14. IMPRESSION: No evidence of high-grade or macroscopic prostate carcinoma. Mild  benign prostatic hyperplasia. Electronically Signed   By: Rockey Kilts M.D.   On: 10/31/2024 11:45    Disposition: Plan for discharge home today pending progress with PT.     Follow-up Information     Kip Lynwood Double, PA-C Follow up in 14 day(s).   Specialty: Physician Assistant Why: [facility] [place] information: [address] [place] [address] [phone]                Signed: Lynwood LITTIE Kip PA-C 11/13/2024, 10:20 AM  "

## 2024-11-12 NOTE — Anesthesia Preprocedure Evaluation (Addendum)
"                                    Anesthesia Evaluation  Patient identified by MRN, date of birth, ID band Patient awake    Reviewed: Allergy & Precautions, H&P , NPO status , Patient's Chart, lab work & pertinent test results  Airway Mallampati: II  TM Distance: >3 FB Neck ROM: full    Dental  (+) Chipped   Pulmonary neg pulmonary ROS   Pulmonary exam normal        Cardiovascular negative cardio ROS Normal cardiovascular exam     Neuro/Psych CVA (06/2024)  negative psych ROS   GI/Hepatic Neg liver ROS,GERD  ,,  Endo/Other  negative endocrine ROS    Renal/GU      Musculoskeletal   Abdominal   Peds  Hematology negative hematology ROS (+)   Anesthesia Other Findings Pt with history of seizure-like event in September of last year.  The patient has followed up with neurology in addition to cardiology.  He did undergo a MRI scan of the brain in addition to CT scan and EEG.  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  He was advised to wait at least 3 months from the date of the initial seizure/strokelike event if possible preferably at least 6 months   Past Medical History: No date: Chest pain     Comment:  July, 2012 No date: GERD (gastroesophageal reflux disease)    Reproductive/Obstetrics negative OB ROS                              Anesthesia Physical Anesthesia Plan  ASA: 3  Anesthesia Plan: Spinal   Post-op Pain Management:    Induction:   PONV Risk Score and Plan: Propofol  infusion  Airway Management Planned: Natural Airway  Additional Equipment:   Intra-op Plan:   Post-operative Plan:   Informed Consent: I have reviewed the patients History and Physical, chart, labs and discussed the  procedure including the risks, benefits and alternatives for the proposed anesthesia with the patient or authorized representative who has indicated his/her understanding and acceptance.     Dental Advisory Given  Plan Discussed with: CRNA and Surgeon  Anesthesia Plan Comments:          Anesthesia Quick Evaluation  "

## 2024-11-13 ENCOUNTER — Telehealth (HOSPITAL_COMMUNITY): Payer: Self-pay | Admitting: Pharmacy Technician

## 2024-11-13 ENCOUNTER — Other Ambulatory Visit (HOSPITAL_COMMUNITY): Payer: Self-pay

## 2024-11-13 ENCOUNTER — Other Ambulatory Visit: Payer: Self-pay

## 2024-11-13 LAB — BASIC METABOLIC PANEL WITH GFR
Anion gap: 10 (ref 5–15)
BUN: 20 mg/dL (ref 8–23)
CO2: 21 mmol/L — ABNORMAL LOW (ref 22–32)
Calcium: 8.7 mg/dL — ABNORMAL LOW (ref 8.9–10.3)
Chloride: 104 mmol/L (ref 98–111)
Creatinine, Ser: 0.78 mg/dL (ref 0.61–1.24)
GFR, Estimated: >60 mL/min
Glucose, Bld: 134 mg/dL — ABNORMAL HIGH (ref 70–99)
Potassium: 4.1 mmol/L (ref 3.5–5.1)
Sodium: 136 mmol/L (ref 135–145)

## 2024-11-13 LAB — CBC
HCT: 33.5 % — ABNORMAL LOW (ref 39.0–52.0)
Hemoglobin: 11.5 g/dL — ABNORMAL LOW (ref 13.0–17.0)
MCH: 34.7 pg — ABNORMAL HIGH (ref 26.0–34.0)
MCHC: 34.3 g/dL (ref 30.0–36.0)
MCV: 101.2 fL — ABNORMAL HIGH (ref 80.0–100.0)
Platelets: 238 10*3/uL (ref 150–400)
RBC: 3.31 MIL/uL — ABNORMAL LOW (ref 4.22–5.81)
RDW: 12.2 % (ref 11.5–15.5)
WBC: 14.1 10*3/uL — ABNORMAL HIGH (ref 4.0–10.5)
nRBC: 0 % (ref 0.0–0.2)

## 2024-11-13 MED ORDER — ACETAMINOPHEN 500 MG PO TABS
1000.0000 mg | ORAL_TABLET | Freq: Four times a day (QID) | ORAL | 0 refills | Status: AC | PRN
  Filled 2024-11-13: qty 60, 8d supply, fill #0

## 2024-11-13 MED ORDER — ONDANSETRON HCL 4 MG PO TABS
4.0000 mg | ORAL_TABLET | Freq: Four times a day (QID) | ORAL | 0 refills | Status: AC | PRN
  Filled 2024-11-13: qty 30, 8d supply, fill #0

## 2024-11-13 MED ORDER — KETOROLAC TROMETHAMINE 10 MG PO TABS
10.0000 mg | ORAL_TABLET | Freq: Four times a day (QID) | ORAL | 0 refills | Status: AC | PRN
  Filled 2024-11-13: qty 20, 5d supply, fill #0

## 2024-11-13 MED ORDER — ASPIRIN 81 MG PO CHEW
324.0000 mg | CHEWABLE_TABLET | Freq: Two times a day (BID) | ORAL | Status: DC
  Administered 2024-11-13: 324 mg via ORAL
  Filled 2024-11-13: qty 4

## 2024-11-13 MED ORDER — ASPIRIN 325 MG PO TBEC
325.0000 mg | DELAYED_RELEASE_TABLET | Freq: Two times a day (BID) | ORAL | 0 refills | Status: AC
  Filled 2024-11-13: qty 28, 14d supply, fill #0

## 2024-11-13 NOTE — Evaluation (Signed)
 Physical Therapy Evaluation Patient Details Name: Jorge Bailey MRN: [id number] DOB: 09/02/1956 Today's Date: 11/13/2024  History of Present Illness  Jorge Bailey is an 69 y.o. male who was admitted 11/12/2024 with a diagnosis of advanced degenerative joint disease of the right hip and went to the operating room on 11/12/2024 for posterior THA  Clinical Impression  Pt upright in room with furniture cruising upon arrival to the room.  Pt and therapist discussed precautions and pt was able to verbalize precautions without any education provided.  Therapist re-iterated the precautions for safety methods and pt was able to follow throughout the session.  Pt then ambulated to the stairs within the therapy area and was able to perform the set x3 with differing techniques for which the pt was most comfortable with.  Pt preferred utilizing the backwards method initially taught by therapist.  Pt then ambulated back to the room without any concerns or questions.  Pt advised to continue to be careful and he would be set with mobility.  Pt voiced understanding and was left with all needs met and nursing notified of mobility status.   Pt will continue to benefit from skilled therapy to address remaining deficits in order to improve overall QoL and return to PLOF.           If plan is discharge home, recommend the following:     Can travel by private vehicle        Equipment Recommendations    Recommendations for Other Services       Functional Status Assessment       Precautions / Restrictions Precautions Precautions: Posterior Hip;Fall Precaution Booklet Issued: No Restrictions Weight Bearing Restrictions Per Provider Order: Yes RLE Weight Bearing Per Provider Order: Weight bearing as tolerated      Mobility  Bed Mobility Overal bed mobility: Modified Independent                  Transfers Overall transfer level: Modified independent Equipment used: Rolling walker (2 wheels)                     Ambulation/Gait Ambulation/Gait assistance: Supervision, Contact guard assist Gait Distance (Feet): 120 Feet Assistive device: Rolling walker (2 wheels) Gait Pattern/deviations: Step-through pattern Gait velocity: decreased     General Gait Details: pt able to ambulate well with the use of the walker and without any LOB.  Pt somewhat unsteady without the use of the walker upon arrival to the room when he was mobilizing without AD.  Stairs Stairs: Yes Stairs assistance: Contact guard assist Stair Management: One rail Left, Two rails, Alternating pattern, Sideways, Backwards Number of Stairs: 12 General stair comments: Pt attempted stairs multiple ways to simulate all methods of use, with pt preferring ambulating backwards when going up the steps in the garage.  Wheelchair Mobility     Tilt Bed    Modified Rankin (Stroke Patients Only)       Balance Overall balance assessment: Mild deficits observed, not formally tested                                           Pertinent Vitals/Pain Pain Assessment Pain Assessment: No/denies pain    Home Living Family/patient expects to be discharged to:: Private residence Living Arrangements: Spouse/significant other Available Help at Discharge: Family;Available 24 hours/day Type of Home: House  Alternate Level Stairs-Number of Steps: 7 Home Layout: Two level Home Equipment: Agricultural Consultant (2 wheels);Crutches      Prior Function Prior Level of Function : Independent/Modified Independent             Mobility Comments: using walker intermittently ADLs Comments: independent, painful with reaching B distal LE     Extremity/Trunk Assessment   Upper Extremity Assessment Upper Extremity Assessment: Overall WFL for tasks assessed    Lower Extremity Assessment Lower Extremity Assessment: Defer to PT evaluation       Communication   Communication Communication: No apparent  difficulties Factors Affecting Communication: Hearing impaired    Cognition Arousal: Alert Behavior During Therapy: WFL for tasks assessed/performed                             Following commands: Intact       Cueing Cueing Techniques: Verbal cues     General Comments      Exercises     Assessment/Plan    PT Assessment Patient needs continued PT services  PT Problem List Decreased strength;Decreased range of motion;Decreased activity tolerance;Decreased balance;Decreased mobility;Decreased knowledge of use of DME;Decreased safety awareness       PT Treatment Interventions DME instruction;Gait training;Stair training;Therapeutic activities;Therapeutic exercise;Balance training;Neuromuscular re-education    PT Goals (Current goals can be found in the Care Plan section)  Acute Rehab PT Goals Patient Stated Goal: to get home as soon as possible. PT Goal Formulation: With patient Time For Goal Achievement: 11/27/24 Potential to Achieve Goals: Good    Frequency BID     Co-evaluation               AM-PAC PT 6 Clicks Mobility  Outcome Measure Help needed turning from your back to your side while in a flat bed without using bedrails?: A Little Help needed moving from lying on your back to sitting on the side of a flat bed without using bedrails?: A Little Help needed moving to and from a bed to a chair (including a wheelchair)?: A Little Help needed standing up from a chair using your arms (e.g., wheelchair or bedside chair)?: A Little Help needed to walk in hospital room?: A Little Help needed climbing 3-5 steps with a railing? : A Little 6 Click Score: 18    End of Session Equipment Utilized During Treatment: Gait belt Activity Tolerance: Patient tolerated treatment well Patient left: in bed   PT Visit Diagnosis: Unsteadiness on feet (R26.81);Other abnormalities of gait and mobility (R26.89);Muscle weakness (generalized) (M62.81);Difficulty in  walking, not elsewhere classified (R26.2);Pain Pain - Right/Left: Right Pain - part of body: Hip    Time: 8986-8958 PT Time Calculation (min) (ACUTE ONLY): 28 min   Charges:   PT Evaluation $PT Eval Low Complexity: 1 Low PT Treatments $Therapeutic Activity: 8-22 mins PT General Charges $$ ACUTE PT VISIT: 1 Visit         Fonda Simpers, PT, DPT Physical Therapist - [facility]  [facility]  11/13/24, 1:31 PM

## 2024-11-13 NOTE — Progress Notes (Signed)
" °  Subjective: 1 Day Post-Op Procedures (LRB): ARTHROPLASTY, HIP, TOTAL,POSTERIOR APPROACH (Right) Patient reports pain as mild.   Patient is well, and has had no acute complaints or problems Plan is to go Home after hospital stay. Negative for chest pain and shortness of breath Fever: no Gastrointestinal:Negative for nausea and vomiting  Objective: Vital signs in last 24 hours: Temp:  [97.3 F (36.3 C)-98.8 F (37.1 C)] 97.8 F (36.6 C) (02/05 0741) Pulse Rate:  [57-83] 73 (02/05 0741) Resp:  [13-22] 15 (02/05 0741) BP: (106-145)/(78-108) 127/90 (02/05 0741) SpO2:  [90 %-100 %] 97 % (02/05 0741) Weight:  [78 kg] 78 kg (02/04 1246)  Intake/Output from previous day:  Intake/Output Summary (Last 24 hours) at 11/13/2024 1016 Last data filed at 11/13/2024 0344 Gross per 24 hour  Intake 1852.5 ml  Output 1000 ml  Net 852.5 ml    Intake/Output this shift: No intake/output data recorded.  Labs: Recent Labs    11/12/24 1234 11/13/24 0700  HGB 15.2 11.5*   Recent Labs    11/12/24 1234 11/13/24 0700  WBC 7.6 14.1*  RBC 4.40 3.31*  HCT 44.2 33.5*  PLT 253 238   Recent Labs    11/12/24 1234 11/13/24 0700  NA 141 136  K 4.3 4.1  CL 103 104  CO2 27 21*  BUN 17 20  CREATININE 0.83 0.78  GLUCOSE 96 134*  CALCIUM 9.6 8.7*   No results for input(s): LABPT, INR in the last 72 hours.   EXAM General - Patient is Alert, Appropriate, and Oriented Extremity - ABD soft Neurovascular intact Dorsiflexion/Plantar flexion intact Incision: scant drainage No cellulitis present Compartment soft Dressing/Incision - mild bloody drainage noted to the right hip honeycomb dressing.   New honeycomb dressing applied. Motor Function - intact, moving foot and toes well on exam.  Intact bowel sounds.  Abdomen soft to palpation.  Past Medical History:  Diagnosis Date   Chest pain    July, 2012   GERD (gastroesophageal reflux disease)     Assessment/Plan: 1 Day Post-Op  Procedures (LRB): ARTHROPLASTY, HIP, TOTAL,POSTERIOR APPROACH (Right) Principal Problem:   Status post total hip replacement, right  Estimated body mass index is 27.76 kg/m as calculated from the following:   Height as of this encounter: 5' 6 (1.676 m).   Weight as of this encounter: 78 kg. Advance diet Up with therapy D/C IV fluids when tolerating po intake.  Labs and vitals reviewed.  WBC 14.1 this AM, likely reactive from surgery. Up with therapy today. Continue to work on a BM. Plan for discharge home today pending progress with PT.  DVT Prophylaxis - TED hose and Aspirin  Weight-Bearing as tolerated to right leg  J. Gustavo Level, PA-C [facility] 11/13/2024, 10:16 AM  "

## 2024-11-13 NOTE — Evaluation (Signed)
 Occupational Therapy Evaluation Patient Details Name: Jorge Bailey MRN: [id number] DOB: [birth year] Today's Date: 11/13/2024   History of Present Illness   Jorge Bailey is an 69 y.o. 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 Prior to hospital admission, patient was active and independent but having pain with all mobility. Patient lives in 2 story home with good support from spouse who is able/willing to assist as needed. OT educated on posterior hip precautions in relation to mobility/ADLs, with patient demonstrating understanding throughout eval, reports wife will assist as needed. Patient performed bed mobility and transfers without physical A and intermittent cues to maintain posterior hip precautions. Patient presents with deficits in dynamic standing balance, affecting safe and optimal ADL completion. Patient is currently requiring min-mod A for LB self care tasks due to posterior hip precautions and lack of AD.  Paient would benefit from skilled OT services to address noted impairments and functional limitations (see below for any additional details) in order to maximize safety and independence while minimizing future risk of falls, injury, and readmission. Anticipate the need for follow up OT services upon acute hospital DC.       If plan is discharge home, recommend the following:   A little help with bathing/dressing/bathroom;Help with stairs or ramp for entrance     Functional Status Assessment   Patient has had a recent decline in their functional status and demonstrates the ability to make significant improvements in function in a reasonable and predictable amount of time.     Equipment Recommendations   BSC/3in1     Recommendations for Other Services          Precautions/Restrictions   Precautions Precautions: Posterior Hip;Fall Precaution Booklet Issued: No Restrictions Weight Bearing Restrictions Per Provider Order: Yes RLE Weight Bearing Per Provider Order: Weight bearing as tolerated     Mobility Bed Mobility Overal bed mobility: Modified Independent                  Transfers Overall transfer level: Modified independent Equipment used: Rolling walker (2 wheels)                      Balance Overall balance assessment: Mild deficits observed, not formally tested                                         ADL either performed or assessed with clinical judgement   ADL Overall ADL's : Needs assistance/impaired                                       General ADL Comments: will require A with LB self care tasks due to posterior hip precautions     Vision         Perception         Praxis         Pertinent Vitals/Pain Pain Assessment Pain Assessment: No/denies pain     Extremity/Trunk Assessment Upper Extremity Assessment Upper Extremity Assessment: Overall WFL for tasks assessed   Lower Extremity Assessment Lower Extremity Assessment: Defer to PT evaluation  Communication Communication Communication: No apparent difficulties Factors Affecting Communication: Hearing impaired   Cognition Arousal: Alert Behavior During Therapy: WFL for tasks assessed/performed Cognition: No apparent impairments                               Following commands: Intact       Cueing  General Comments   Cueing Techniques: Verbal cues      Exercises     Shoulder Instructions      Home Living Family/patient expects to be discharged to:: Private residence Living Arrangements: Spouse/significant other Available Help at Discharge: Family;Available 24 hours/day Type of Home: House       Home Layout: Two level Alternate Level Stairs-Number of Steps:  7   Bathroom Shower/Tub: Producer, Television/film/video: Standard     Home Equipment: Agricultural Consultant (2 wheels);Crutches          Prior Functioning/Environment Prior Level of Function : Independent/Modified Independent             Mobility Comments: using walker intermittently ADLs Comments: independent, painful with reaching B distal LE    OT Problem List: Decreased activity tolerance   OT Treatment/Interventions: Self-care/ADL training;Therapeutic activities;Therapeutic exercise;Balance training      OT Goals(Current goals can be found in the care plan section)   Acute Rehab OT Goals Patient Stated Goal: to go home OT Goal Formulation: With patient Time For Goal Achievement: 11/27/24 Potential to Achieve Goals: Good ADL Goals Pt Will Perform Grooming: with modified independence;standing Pt Will Perform Lower Body Dressing: with min assist;with adaptive equipment;sit to/from stand Pt Will Transfer to Toilet: with modified independence;ambulating;regular height toilet Pt Will Perform Toileting - Clothing Manipulation and hygiene: with modified independence;sit to/from stand   OT Frequency:  Min 2X/week    Co-evaluation              AM-PAC OT 6 Clicks Daily Activity     Outcome Measure Help from another person eating meals?: None Help from another person taking care of personal grooming?: None Help from another person toileting, which includes using toliet, bedpan, or urinal?: None Help from another person bathing (including washing, rinsing, drying)?: A Little Help from another person to put on and taking off regular upper body clothing?: None Help from another person to put on and taking off regular lower body clothing?: A Little 6 Click Score: 22   End of Session Equipment Utilized During Treatment: Rolling walker (2 wheels) Nurse Communication: Mobility status  Activity Tolerance: Patient tolerated treatment well Patient left: in chair;with  call bell/phone within reach;with nursing/sitter in room  OT Visit Diagnosis: Unsteadiness on feet (R26.81)                Time: 9095-9069 OT Time Calculation (min): 26 min Charges:  OT General Charges $OT Visit: 1 Visit OT Evaluation $OT Eval Low Complexity: 1 Low  Rogers Clause, OT/L MSOT, 11/13/2024

## 2024-11-13 NOTE — Telephone Encounter (Signed)
 Patient Product/process Development Scientist completed.    The patient is insured through [employer].     Ran test claim for Eliquis  2.5 mg and the current 30 day co-pay is $48.00.   This test claim was processed through [facility]- copay amounts may vary at other pharmacies due to pharmacy/plan contracts, or as the patient moves through the different stages of their insurance plan.     Reyes Sharps, CPHT Pharmacy Technician Patient Advocate Specialist Lead [facility] Patient Advocate Team Direct Number: [phone]  Fax: [phone]

## 2024-11-13 NOTE — Anesthesia Postprocedure Evaluation (Signed)
"   Anesthesia Post Note  Patient: [name]  Procedure(s) Performed: ARTHROPLASTY, HIP, TOTAL,POSTERIOR APPROACH (Right: Hip)  Patient location during evaluation: Short Stay Anesthesia Type: Spinal Level of consciousness: awake and alert and oriented Pain management: satisfactory to patient Vital Signs Assessment: post-procedure vital signs reviewed and stable Respiratory status: spontaneous breathing Cardiovascular status: stable Postop Assessment: patient able to bend at knees, no apparent nausea or vomiting, adequate PO intake and able to ambulate Anesthetic complications: no Comments: Has been able to void.   No notable events documented.   Last Vitals:  Vitals:   11/13/24 0030 11/13/24 0427  BP: 124/82 106/78  Pulse: 82 83  Resp: 16 18  Temp: 36.4 C 37.1 C  SpO2: 94% 94%    Last Pain:  Vitals:   11/13/24 0500  TempSrc:   PainSc: 0-No pain                 Kamal Jurgens Dyane      "

## 2024-11-13 NOTE — Plan of Care (Signed)
" °  Problem: Education: Goal: Knowledge of the prescribed therapeutic regimen will improve Outcome: Progressing   Problem: Bowel/Gastric: Goal: Gastrointestinal status for postoperative course will improve Outcome: Progressing   Problem: Neurological: Goal: Will regain or maintain usual level of consciousness Outcome: Progressing   Problem: Clinical Measurements: Goal: Ability to maintain clinical measurements within normal limits Outcome: Progressing   Problem: Respiratory: Goal: Will regain and/or maintain adequate ventilation Outcome: Progressing   "

## 2024-11-13 NOTE — Progress Notes (Signed)
 DISCHARGE NOTE:  Pt given discharge instructions and verbalized understanding. 2 honeycomb dressings and TED hose on both legs. Beds to meds medications sent with pt, pt wheeled to car, wife providing transportation home.

## 2024-12-18 ENCOUNTER — Ambulatory Visit: Admitting: Neurology

## 2025-03-30 ENCOUNTER — Other Ambulatory Visit

## 2025-03-30 DIAGNOSIS — R972 Elevated prostate specific antigen [PSA]: Secondary | ICD-10-CM

## 2025-04-08 ENCOUNTER — Ambulatory Visit: Admitting: Urology
